# Patient Record
Sex: Female | Born: 1985 | Race: White | Hispanic: No | Marital: Married | Smoking: Never smoker
Health system: Southern US, Community
[De-identification: ages and names within clinical notes are randomized; demographics above are authoritative.]

## PROBLEM LIST (undated history)

## (undated) DIAGNOSIS — E669 Obesity, unspecified: Secondary | ICD-10-CM

## (undated) HISTORY — PX: OTHER SURGICAL HISTORY: SHX169

## (undated) HISTORY — DX: Obesity, unspecified: E66.9

---
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
## 2007-01-16 ENCOUNTER — Ambulatory Visit: Payer: Self-pay | Admitting: Family Medicine

## 2007-02-10 ENCOUNTER — Ambulatory Visit: Payer: Self-pay | Admitting: Family Medicine

## 2007-02-13 ENCOUNTER — Ambulatory Visit (HOSPITAL_COMMUNITY): Admission: RE | Admit: 2007-02-13 | Discharge: 2007-02-13 | Payer: Self-pay | Admitting: Family Medicine

## 2007-04-13 ENCOUNTER — Ambulatory Visit: Payer: Self-pay | Admitting: Family Medicine

## 2007-04-16 ENCOUNTER — Ambulatory Visit: Payer: Self-pay | Admitting: Family Medicine

## 2007-04-16 ENCOUNTER — Ambulatory Visit (HOSPITAL_COMMUNITY): Admission: RE | Admit: 2007-04-16 | Discharge: 2007-04-16 | Payer: Self-pay | Admitting: Family Medicine

## 2007-04-16 LAB — CONVERTED CEMR LAB
BUN: 5 mg/dL — ABNORMAL LOW (ref 6–23)
Basophils Absolute: 0 10*3/uL (ref 0.0–0.1)
CO2: 26 meq/L (ref 19–32)
Calcium: 9.4 mg/dL (ref 8.4–10.5)
Creatinine, Ser: 0.83 mg/dL (ref 0.40–1.20)
Eosinophils Absolute: 0.4 10*3/uL (ref 0.0–0.7)
Glucose, Bld: 94 mg/dL (ref 70–99)
Hemoglobin: 12.8 g/dL (ref 12.0–15.0)
Lymphocytes Relative: 16 % (ref 12–46)
Lymphs Abs: 0.7 10*3/uL (ref 0.7–3.3)
Monocytes Absolute: 0.2 10*3/uL (ref 0.2–0.7)
Neutrophils Relative %: 70 % (ref 43–77)
Potassium: 4.8 meq/L (ref 3.5–5.3)
RBC: 4.69 M/uL (ref 3.87–5.11)
RDW: 16.1 % — ABNORMAL HIGH (ref 11.5–14.0)
Sodium: 136 meq/L (ref 135–145)
WBC: 4.4 10*3/uL (ref 4.0–10.5)

## 2007-05-21 ENCOUNTER — Ambulatory Visit: Payer: Self-pay | Admitting: Family Medicine

## 2007-07-28 ENCOUNTER — Encounter: Payer: Self-pay | Admitting: Family Medicine

## 2007-07-28 ENCOUNTER — Other Ambulatory Visit: Admission: RE | Admit: 2007-07-28 | Discharge: 2007-07-28 | Payer: Self-pay | Admitting: Family Medicine

## 2007-07-28 ENCOUNTER — Ambulatory Visit: Payer: Self-pay | Admitting: Family Medicine

## 2007-08-20 ENCOUNTER — Encounter: Payer: Self-pay | Admitting: Family Medicine

## 2007-08-24 IMAGING — CR DG CHEST 2V
2 series · 2 of 2 positions shown · non-contrast
Comparison: 06/12/06.

CLINICAL DATA: Bronchitis, sinusitis.  
 CHEST - 2 VIEW:

[view not recorded (1 of 2)]
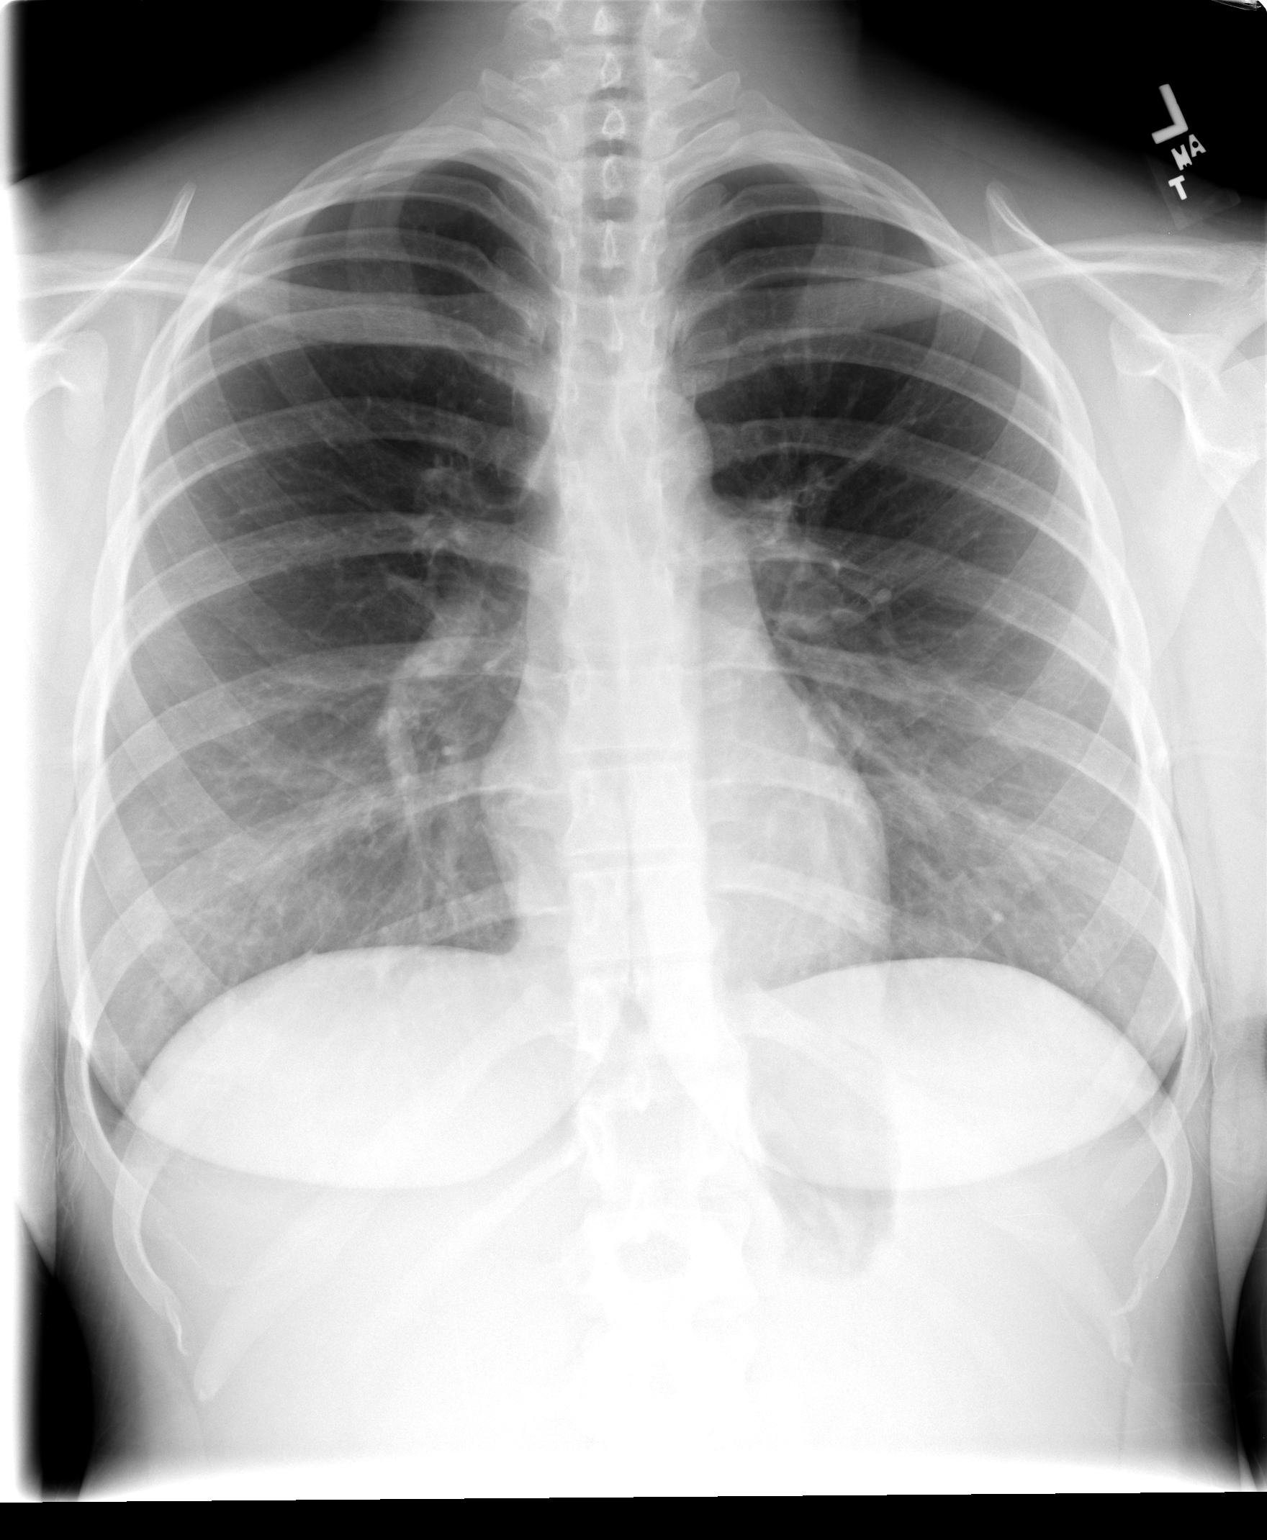

[view not recorded (2 of 2)]
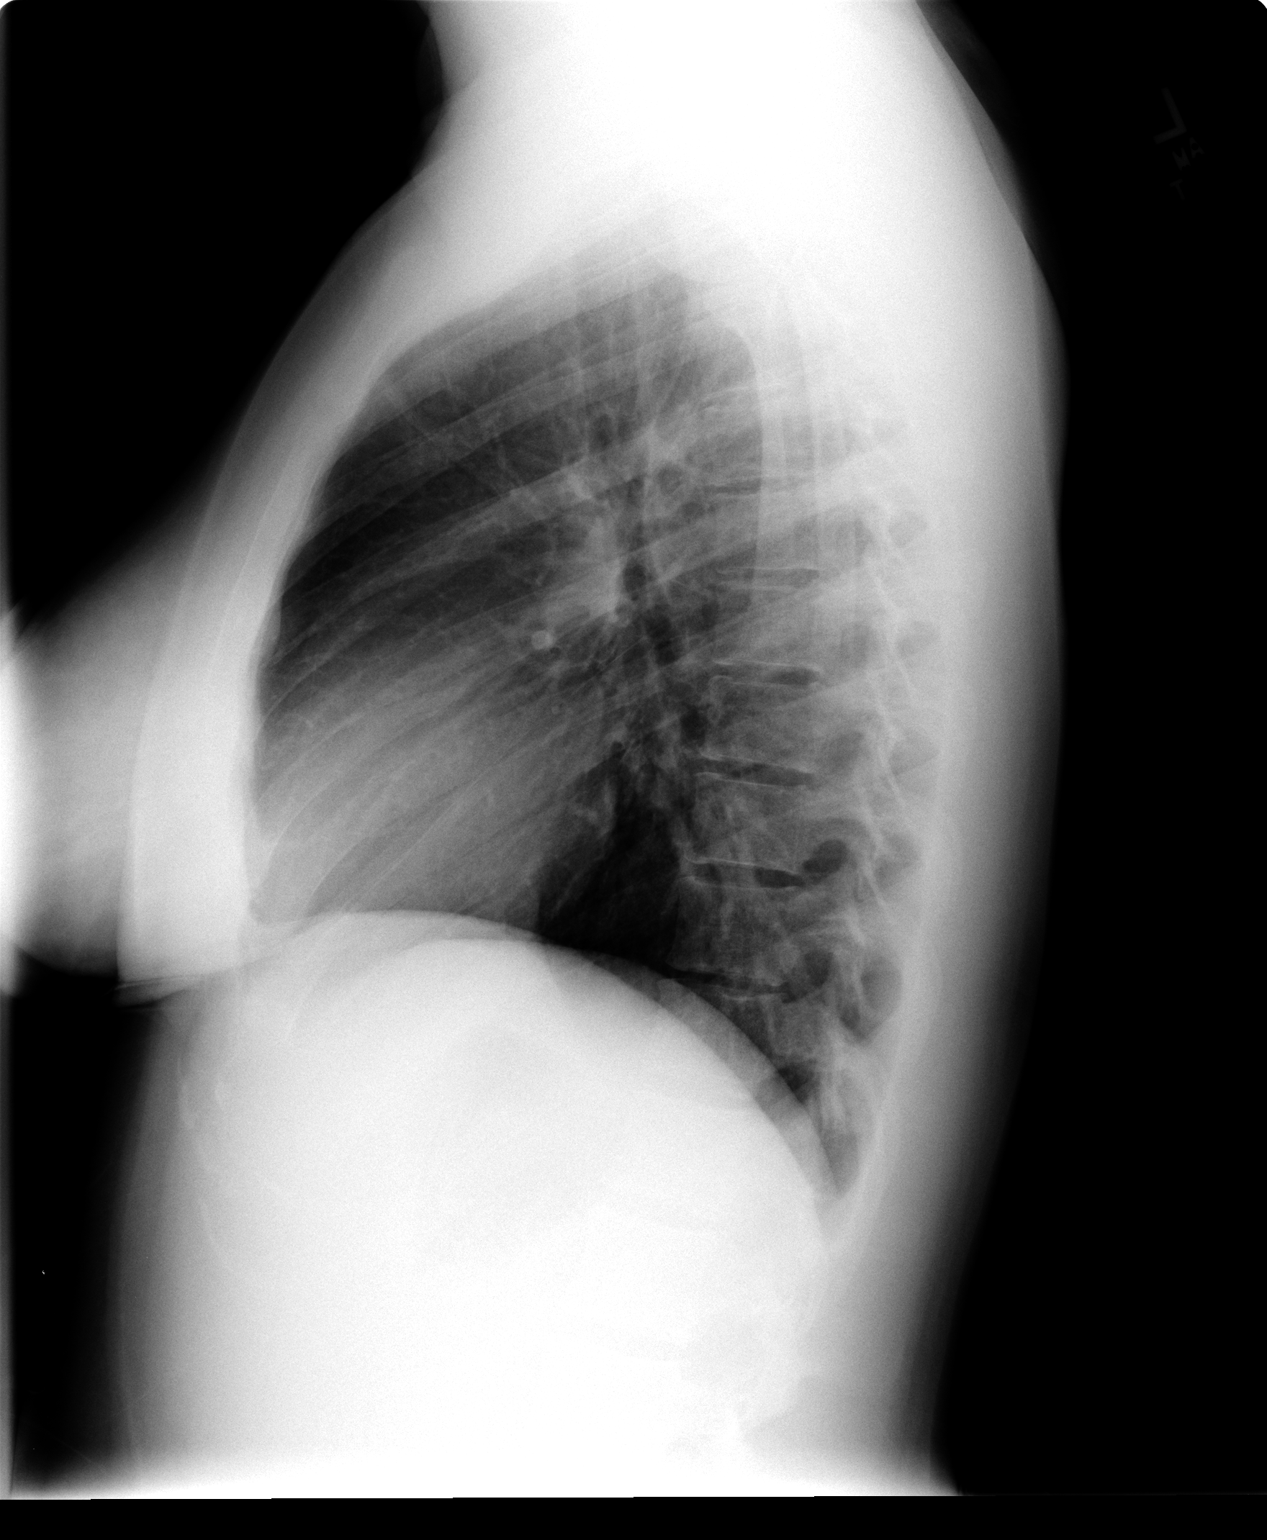

[2 of 2 positions shown; findings below may reference images not displayed]

FINDINGS: Lungs moderately hyperaerated but clear of an active process.  Symmetric hila.  Cardiomediastinal silhouette size and contours are normal.
IMPRESSION: Pulmonary hyperaeration.  No infiltrate/atelectasis.

## 2007-11-13 ENCOUNTER — Ambulatory Visit: Payer: Self-pay | Admitting: Family Medicine

## 2007-12-24 ENCOUNTER — Encounter: Payer: Self-pay | Admitting: Family Medicine

## 2007-12-24 LAB — CONVERTED CEMR LAB
Rubella: 50.9 intl units/mL — ABNORMAL HIGH
Varicella-Zoster Ab, IgM: 1.85 — ABNORMAL HIGH (ref <0.91)

## 2008-01-14 IMAGING — US US CAROTID DUPLEX BILAT
1 series · 14 of 24 positions shown · non-contrast
Comparison: none

HISTORY: Carotid bruit, lightheadedness

[Series 1: carotid · 0.06mm/px · 14 of 73 slices shown]
[im 1/73]
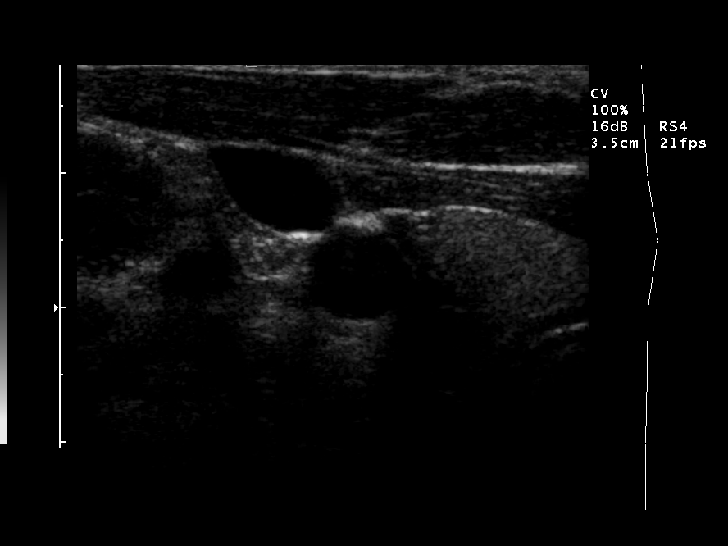
[im 7/73]
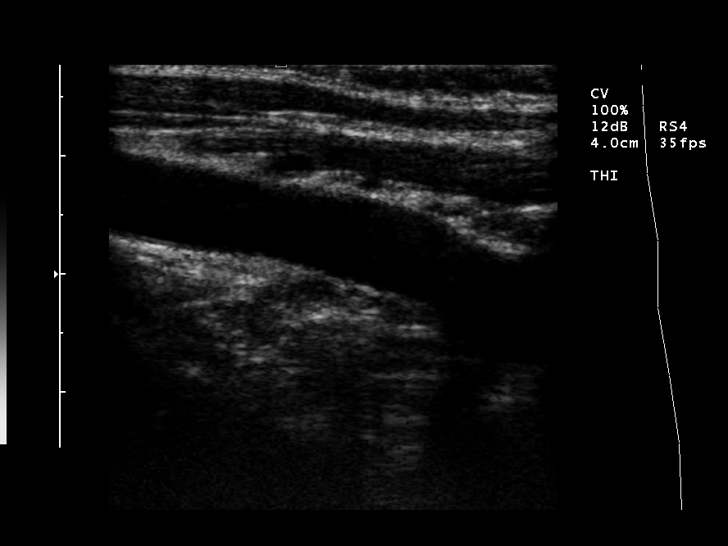
[im 13/73]
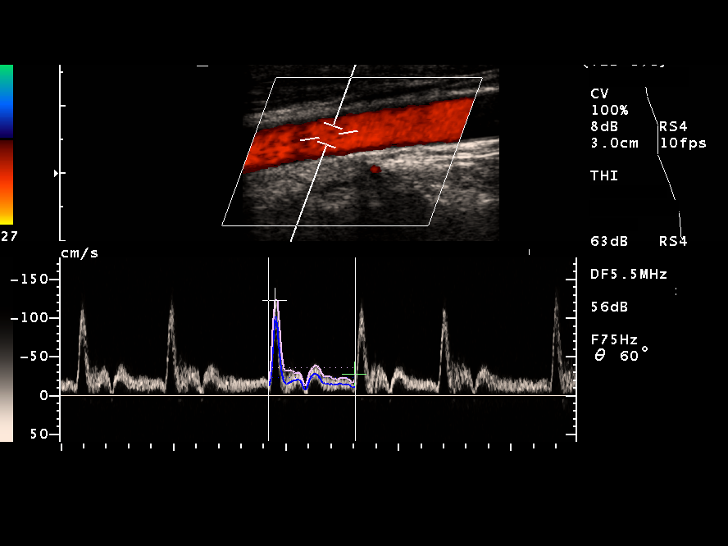
[im 19/73]
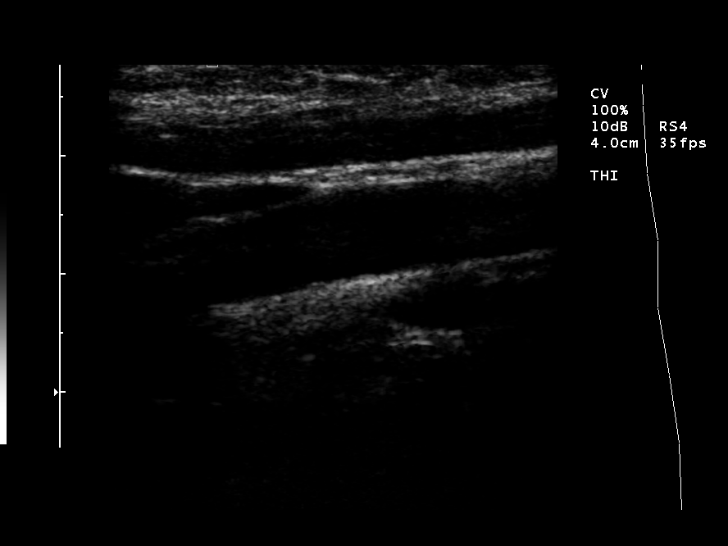
[im 22/73]
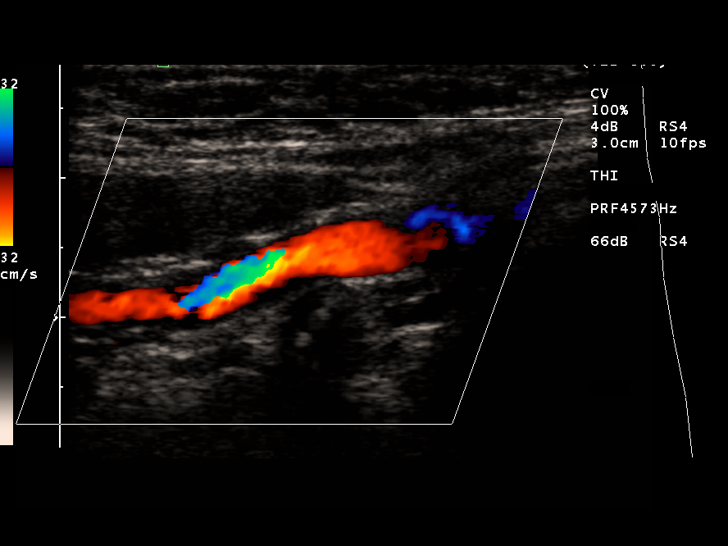
[im 29/73]
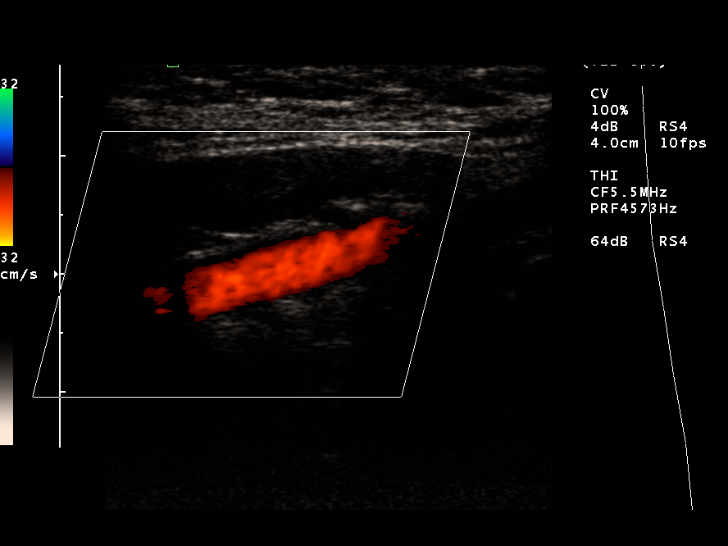
[im 35/73]
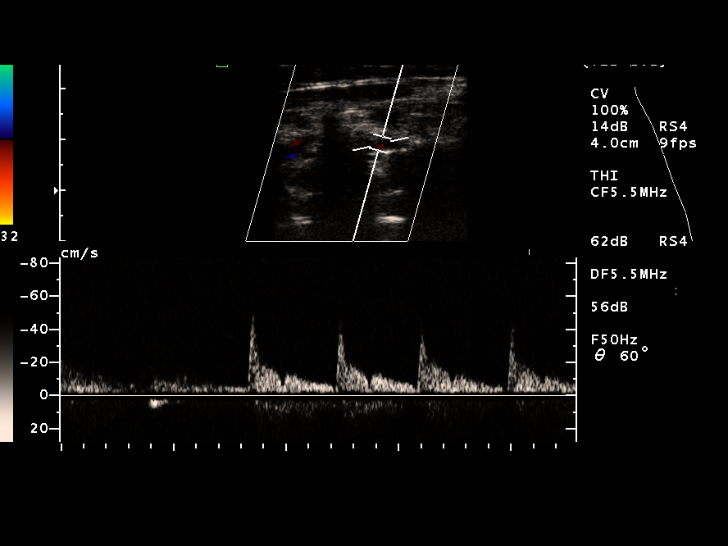
[im 38/73]
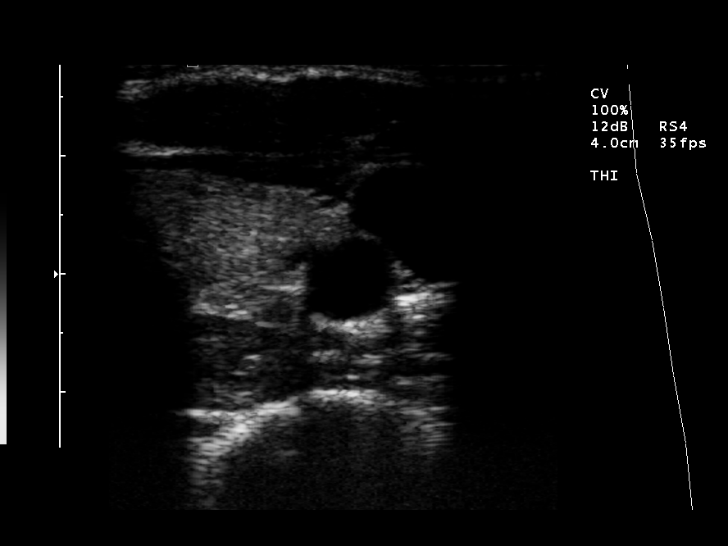
[im 44/73]
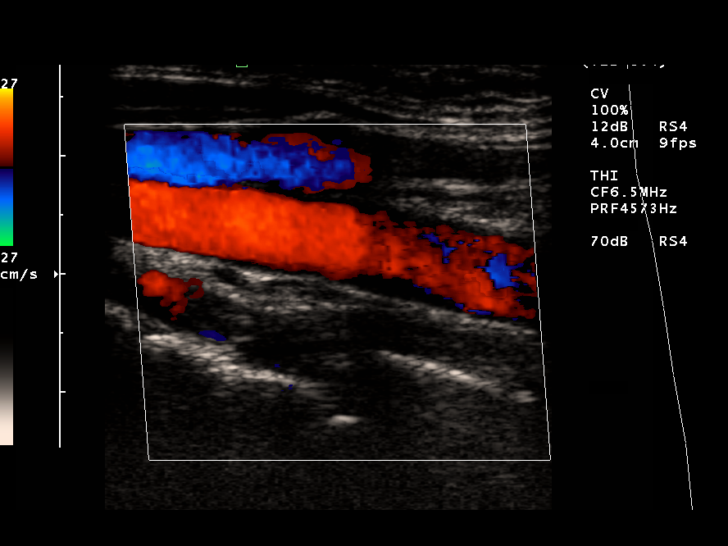
[im 51/73]
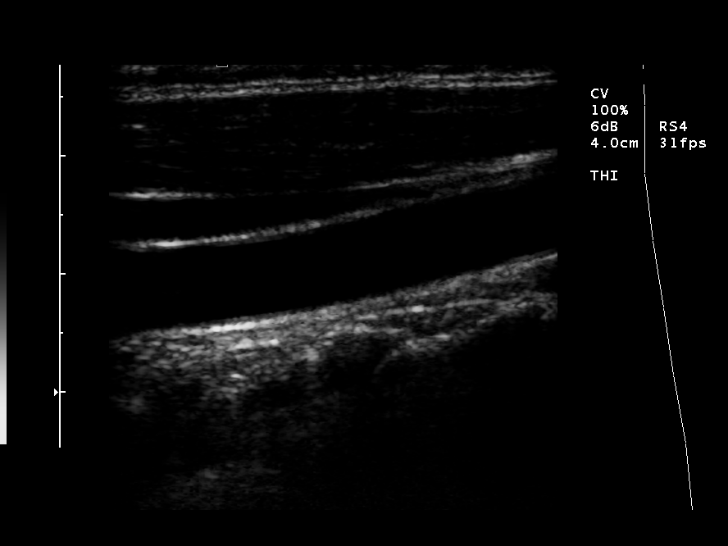
[im 57/73]
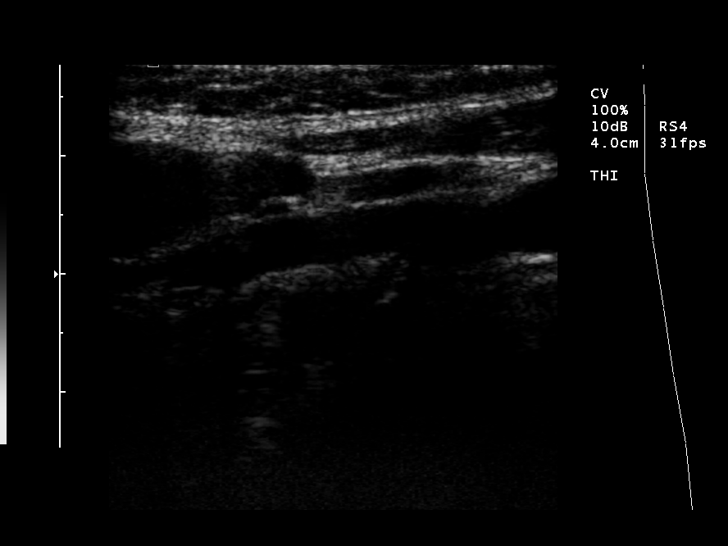
[im 60/73]
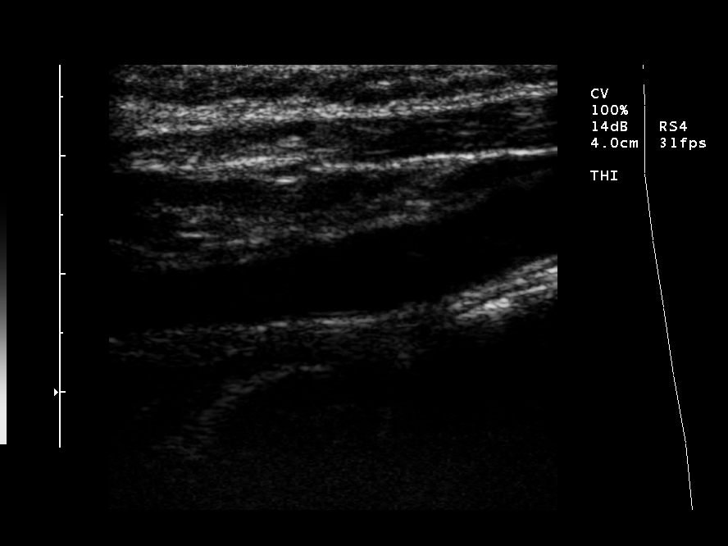
[im 66/73]
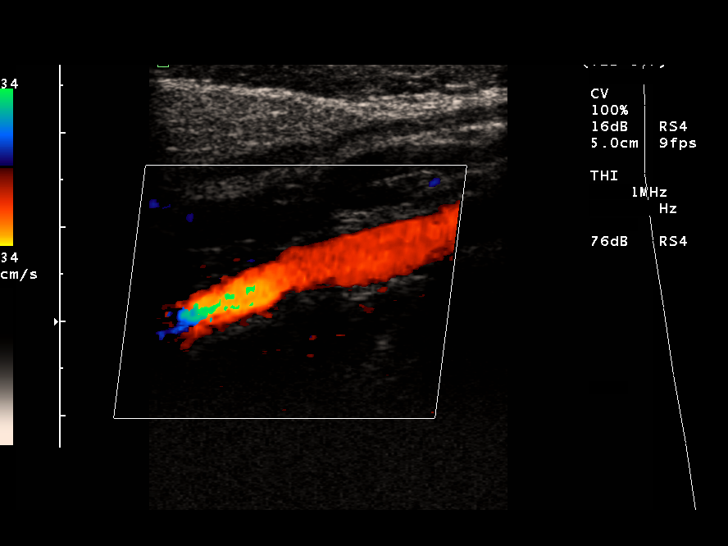
[im 73/73]
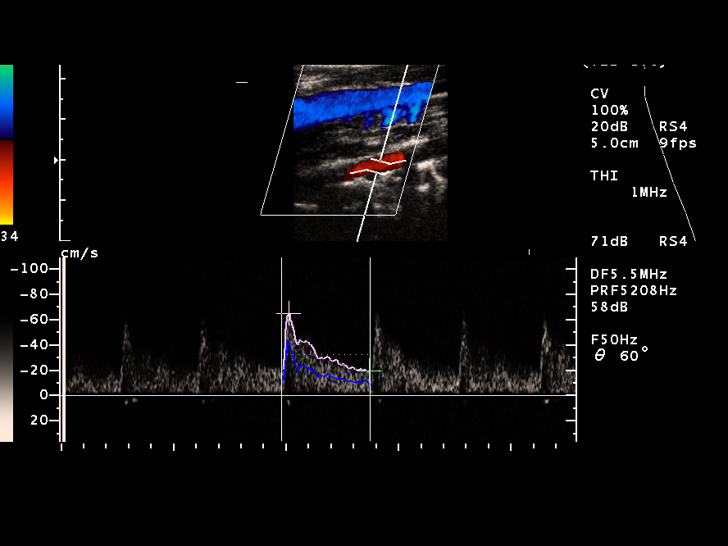

[14 of 24 positions shown; findings below may reference images not displayed]

ULTRASOUND CAROTID DUPLEX DOPPLER BILATERAL:

Gray scale, color Doppler, and pulse wave imaging of carotid systems performed
bilaterally. 

No plaque formation identified on gray scale imaging.
Laminar flow on color Doppler imaging without turbulence or high velocity.
Waveforms unremarkable.
Following peak systolic velocities obtained, in cm per second:

Right CCA  122
Right ICA  83
Right ECA  93
Right ICA/CCA ratio
Right ICA EDV  41
Right CCA EDV  27
Right ICA/CCA EDV ratio

Left  CCA  121
Left  ICA  93
Left  ECA  81
Left  ICA/CCA ratio
Left  ICA EDV  44
Left  CCA EDV  29
Left  ICA/CCA EDV ratio

Antegrade flow present bilateral vertebral region.
IMPRESSION: Normal exam.

## 2008-03-24 ENCOUNTER — Ambulatory Visit: Payer: Self-pay | Admitting: Family Medicine

## 2008-03-24 ENCOUNTER — Encounter: Payer: Self-pay | Admitting: Family Medicine

## 2008-03-25 DIAGNOSIS — J029 Acute pharyngitis, unspecified: Secondary | ICD-10-CM

## 2008-04-29 ENCOUNTER — Telehealth: Payer: Self-pay | Admitting: Family Medicine

## 2008-06-27 ENCOUNTER — Ambulatory Visit: Payer: Self-pay | Admitting: Family Medicine

## 2008-06-27 DIAGNOSIS — M79609 Pain in unspecified limb: Secondary | ICD-10-CM

## 2008-07-07 ENCOUNTER — Telehealth: Payer: Self-pay | Admitting: Family Medicine

## 2008-07-11 ENCOUNTER — Encounter: Payer: Self-pay | Admitting: Family Medicine

## 2008-08-15 ENCOUNTER — Encounter: Payer: Self-pay | Admitting: Family Medicine

## 2008-08-15 ENCOUNTER — Other Ambulatory Visit: Admission: RE | Admit: 2008-08-15 | Discharge: 2008-08-15 | Payer: Self-pay | Admitting: Family Medicine

## 2008-08-15 ENCOUNTER — Ambulatory Visit: Payer: Self-pay | Admitting: Family Medicine

## 2008-08-15 DIAGNOSIS — N760 Acute vaginitis: Secondary | ICD-10-CM | POA: Insufficient documentation

## 2008-08-15 DIAGNOSIS — R5383 Other fatigue: Secondary | ICD-10-CM

## 2008-08-15 DIAGNOSIS — R5381 Other malaise: Secondary | ICD-10-CM

## 2008-08-16 ENCOUNTER — Encounter: Payer: Self-pay | Admitting: Family Medicine

## 2008-08-16 LAB — CONVERTED CEMR LAB
Candida species: NEGATIVE
GC Probe Amp, Genital: NEGATIVE

## 2008-08-17 LAB — CONVERTED CEMR LAB
BUN: 8 mg/dL (ref 6–23)
Basophils Relative: 0 % (ref 0–1)
CO2: 21 meq/L (ref 19–32)
Chloride: 107 meq/L (ref 96–112)
Eosinophils Absolute: 0.4 10*3/uL (ref 0.0–0.7)
Hemoglobin: 13.2 g/dL (ref 12.0–15.0)
LDL Cholesterol: 113 mg/dL — ABNORMAL HIGH (ref 0–99)
Lymphocytes Relative: 45 % (ref 12–46)
Lymphs Abs: 2.8 10*3/uL (ref 0.7–4.0)
Neutro Abs: 2.7 10*3/uL (ref 1.7–7.7)
Platelets: 265 10*3/uL (ref 150–400)
Potassium: 4.2 meq/L (ref 3.5–5.3)
RDW: 13.8 % (ref 11.5–15.5)
TSH: 1.247 microintl units/mL (ref 0.350–4.50)
Total CHOL/HDL Ratio: 4.4
Triglycerides: 186 mg/dL — ABNORMAL HIGH (ref <150)

## 2009-01-02 ENCOUNTER — Telehealth: Payer: Self-pay | Admitting: Family Medicine

## 2009-01-03 ENCOUNTER — Encounter: Payer: Self-pay | Admitting: Family Medicine

## 2009-02-07 ENCOUNTER — Ambulatory Visit: Payer: Self-pay | Admitting: Family Medicine

## 2009-04-28 ENCOUNTER — Ambulatory Visit: Payer: Self-pay | Admitting: Family Medicine

## 2009-05-05 ENCOUNTER — Telehealth: Payer: Self-pay | Admitting: Family Medicine

## 2009-05-18 ENCOUNTER — Telehealth: Payer: Self-pay | Admitting: Family Medicine

## 2009-06-09 ENCOUNTER — Encounter: Payer: Self-pay | Admitting: Family Medicine

## 2009-07-04 ENCOUNTER — Ambulatory Visit: Payer: Self-pay | Admitting: Family Medicine

## 2009-07-04 DIAGNOSIS — J309 Allergic rhinitis, unspecified: Secondary | ICD-10-CM

## 2009-07-05 ENCOUNTER — Telehealth: Payer: Self-pay | Admitting: Family Medicine

## 2009-07-07 ENCOUNTER — Encounter: Payer: Self-pay | Admitting: Family Medicine

## 2009-07-07 LAB — CONVERTED CEMR LAB
Candida species: NEGATIVE
Gardnerella vaginalis: NEGATIVE

## 2009-08-07 ENCOUNTER — Telehealth: Payer: Self-pay | Admitting: Family Medicine

## 2009-08-21 ENCOUNTER — Other Ambulatory Visit: Admission: RE | Admit: 2009-08-21 | Discharge: 2009-08-21 | Payer: Self-pay | Admitting: Family Medicine

## 2009-08-21 ENCOUNTER — Ambulatory Visit: Payer: Self-pay | Admitting: Family Medicine

## 2009-08-22 LAB — CONVERTED CEMR LAB
BUN: 10 mg/dL (ref 6–23)
Creatinine, Ser: 0.7 mg/dL (ref 0.40–1.20)
Eosinophils Absolute: 0.3 10*3/uL (ref 0.0–0.7)
Eosinophils Relative: 4 % (ref 0–5)
Glucose, Bld: 82 mg/dL (ref 70–99)
HCT: 37.8 % (ref 36.0–46.0)
HDL: 44 mg/dL (ref >39)
Lymphs Abs: 2.1 10*3/uL (ref 0.7–4.0)
MCHC: 31.7 g/dL (ref 30.0–36.0)
Monocytes Absolute: 0.4 10*3/uL (ref 0.1–1.0)
Monocytes Relative: 7 % (ref 3–12)
Neutro Abs: 3.4 10*3/uL (ref 1.7–7.7)
Neutrophils Relative %: 55 % (ref 43–77)
Platelets: 265 10*3/uL (ref 150–400)
Potassium: 4.4 meq/L (ref 3.5–5.3)
RDW: 14.3 % (ref 11.5–15.5)
Total CHOL/HDL Ratio: 4.7
Triglycerides: 218 mg/dL — ABNORMAL HIGH (ref <150)

## 2010-01-21 ENCOUNTER — Emergency Department (HOSPITAL_COMMUNITY)
Admission: EM | Admit: 2010-01-21 | Discharge: 2010-01-21 | Payer: Self-pay | Source: Home / Self Care | Admitting: Emergency Medicine

## 2010-01-22 ENCOUNTER — Telehealth: Payer: Self-pay | Admitting: Family Medicine

## 2010-02-01 ENCOUNTER — Ambulatory Visit: Payer: Self-pay | Admitting: Family Medicine

## 2010-02-01 DIAGNOSIS — F329 Major depressive disorder, single episode, unspecified: Secondary | ICD-10-CM

## 2010-02-13 ENCOUNTER — Ambulatory Visit: Payer: Self-pay | Admitting: Family Medicine

## 2010-03-15 ENCOUNTER — Ambulatory Visit: Payer: Self-pay | Admitting: Family Medicine

## 2010-05-22 ENCOUNTER — Ambulatory Visit: Payer: Self-pay | Admitting: Family Medicine

## 2010-05-30 ENCOUNTER — Encounter: Payer: Self-pay | Admitting: Family Medicine

## 2010-06-20 ENCOUNTER — Encounter: Payer: Self-pay | Admitting: Family Medicine

## 2010-07-17 ENCOUNTER — Ambulatory Visit: Payer: Self-pay | Admitting: Family Medicine

## 2010-07-17 DIAGNOSIS — J018 Other acute sinusitis: Secondary | ICD-10-CM

## 2010-07-17 DIAGNOSIS — J209 Acute bronchitis, unspecified: Secondary | ICD-10-CM

## 2010-07-20 ENCOUNTER — Telehealth: Payer: Self-pay | Admitting: Family Medicine

## 2010-08-21 LAB — HM PAP SMEAR

## 2010-09-10 ENCOUNTER — Encounter: Payer: Self-pay | Admitting: Family Medicine

## 2010-09-11 LAB — CONVERTED CEMR LAB
Basophils Absolute: 0 10*3/uL (ref 0.0–0.1)
Creatinine, Ser: 0.72 mg/dL (ref 0.40–1.20)
Eosinophils Absolute: 0.2 10*3/uL (ref 0.0–0.7)
HDL: 42 mg/dL (ref >39)
LDL Cholesterol: 110 mg/dL — ABNORMAL HIGH (ref 0–99)
Lymphocytes Relative: 35 % (ref 12–46)
Lymphs Abs: 2.5 10*3/uL (ref 0.7–4.0)
MCV: 80.8 fL (ref 78.0–100.0)
Monocytes Absolute: 0.5 10*3/uL (ref 0.1–1.0)
Monocytes Relative: 7 % (ref 3–12)
Neutro Abs: 3.8 10*3/uL (ref 1.7–7.7)
Neutrophils Relative %: 54 % (ref 43–77)
Platelets: 293 10*3/uL (ref 150–400)
Potassium: 4.7 meq/L (ref 3.5–5.3)
Sodium: 139 meq/L (ref 135–145)
TSH: 0.72 microintl units/mL (ref 0.350–4.500)
Triglycerides: 177 mg/dL — ABNORMAL HIGH (ref <150)
VLDL: 35 mg/dL (ref 0–40)
WBC: 7.1 10*3/uL (ref 4.0–10.5)

## 2010-09-13 ENCOUNTER — Other Ambulatory Visit: Payer: Self-pay | Admitting: Family Medicine

## 2010-09-13 ENCOUNTER — Other Ambulatory Visit
Admission: RE | Admit: 2010-09-13 | Discharge: 2010-09-13 | Payer: Self-pay | Source: Home / Self Care | Admitting: Family Medicine

## 2010-09-13 ENCOUNTER — Ambulatory Visit
Admission: RE | Admit: 2010-09-13 | Discharge: 2010-09-13 | Payer: Self-pay | Source: Home / Self Care | Attending: Family Medicine | Admitting: Family Medicine

## 2010-09-13 DIAGNOSIS — M25569 Pain in unspecified knee: Secondary | ICD-10-CM | POA: Insufficient documentation

## 2010-09-16 DIAGNOSIS — L259 Unspecified contact dermatitis, unspecified cause: Secondary | ICD-10-CM | POA: Insufficient documentation

## 2010-09-17 ENCOUNTER — Encounter: Payer: Self-pay | Admitting: Family Medicine

## 2010-09-18 NOTE — Progress Notes (Signed)
Summary: NECK HURTS  Phone Note Call from Patient   Summary of Call: NECK HAS BEEN HURTING FOR  THE PAST 2 DAYS AND CAN NOT SLEEP KEEPS WAKING HER UP SHE IS TAKING TYLENOL BUT IT IS DOING NO GGOD CALL BACK AT [phone] Initial call taken by: Lind Guest,  July 20, 2010 9:37 AM  Follow-up for Phone Call        i advise ibuprofen 800mg  Take 1 tablet by mouth three times a day #21 for pain, and cyclobenzaprine 10mg  .1 at bedtime , as neededsend in after spking with her pls 10 only,  Follow-up by: Syliva Overman MD,  July 20, 2010 12:33 PM  Additional Follow-up for Phone Call Additional follow up Details #1::        meds sent, patient aware Additional Follow-up by: Adella Hare LPN,  July 20, 2010 2:22 PM    New/Updated Medications: IBUPROFEN 800 MG TABS (IBUPROFEN) one tab by mouth three times a day as needed CYCLOBENZAPRINE HCL 10 MG TABS (CYCLOBENZAPRINE HCL) one tab by mouth at bedtime as needed Prescriptions: CYCLOBENZAPRINE HCL 10 MG TABS (CYCLOBENZAPRINE HCL) one tab by mouth at bedtime as needed  #10 x 0   Entered by:   Adella Hare LPN   Authorized by:   Syliva Overman MD   Signed by:   Adella Hare LPN on 52/84/1324   Method used:   Electronically to        [facility]  [address] (retail)       [address]       [place], [place]  [address]       Ph: [phone]       Fax: [phone]   RxID:   [id number] IBUPROFEN 800 MG TABS (IBUPROFEN) one tab by mouth three times a day as needed  #21 x 0   Entered by:   Adella Hare LPN   Authorized by:   Syliva Overman MD   Signed by:   Adella Hare LPN on 02/16/1600   Method used:   Electronically to        [facility]  [address] (retail)       [address]       [place], [place]  [address]       Ph: [phone]       Fax: [phone]   RxID:   [id number]

## 2010-09-18 NOTE — Letter (Signed)
Summary: labs  labs   Imported By: Curtis Sites 02/07/2010 12:21:02  _____________________________________________________________________  External Attachment:    Type:   Image     Comment:   External Document

## 2010-09-18 NOTE — Letter (Signed)
Summary: [facility]M  [facility] MEDICAL FORM   Imported By: Lind Guest 06/20/2010 17:07:33  _____________________________________________________________________  External Attachment:    Type:   Image     Comment:   External Document

## 2010-09-18 NOTE — Assessment & Plan Note (Signed)
Summary: F UP   Vital Signs:  Patient profile:   25 year old female Menstrual status:  regular Height:      65.5 inches Weight:      222.50 pounds BMI:     36.59 O2 Sat:      98 % Pulse rate:   86 / minute Pulse rhythm:   regular Resp:     16 per minute BP sitting:   110 / 80 Cuff size:   large  Vitals Entered By: Everitt Amber LPN (February 01, 2010 4:33 PM)  Nutrition Counseling: Patient's BMI is greater than 25 and therefore counseled on weight management options. CC: Follow up chronic problems   Primary Care Provider:  Syliva Overman MD  CC:  Follow up chronic problems.  History of Present Illness: Reports  thatshe has not been doing well, in the past 1 month, since she failed and ewxam. swhe is gaining weight as she overcompensates with excessive eating, she wants help. Denies recent fever or chills. Denies sinus pressure, nasal congestion , ear pain or sore throat. Denies chest congestion, or cough productive of sputum. Denies chest pain, palpitations, PND, orthopnea or leg swelling. Denies abdominal pain, nausea, vomitting, diarrhea or constipation. Denies change in bowel movements or bloody stool. Denies dysuria , frequency, incontinence or hesitancy. Denies  joint pain, swelling, or reduced mobility. Denies headaches, vertigo, seizures.  Denies  rash, lesions, or itch.     Current Medications (verified): 1)  Ortho-Novum 7/7/7 (28) 0.5/0.75/1-35 Mg-Mcg  Tabs (Norethin-Eth Estrad Triphasic) .... Take 1 Tablet By Mouth Once A Day  Allergies (verified): No Known Drug Allergies  Review of Systems      See HPI General:  Complains of loss of appetite, malaise, and weakness. Eyes:  Denies vision loss-1 eye. Psych:  Complains of anxiety, depression, easily tearful, irritability, and mental problems; denies suicidal thoughts/plans, thoughts of violence, and unusual visions or sounds; triggered by recent acqademic failure. Endo:  Denies cold intolerance, excessive  hunger, excessive thirst, excessive urination, heat intolerance, polyuria, and weight change. Heme:  Denies abnormal bruising and bleeding. Allergy:  Complains of seasonal allergies; mild symptoms currently.  Physical Exam  General:  Well-developed,obese,in no acute distress; alert,appropriate and cooperative throughout examination HEENT: No facial asymmetry,  EOMI, No sinus tenderness, TM's Clear, oropharynx  pink and moist.   Chest: Clear to auscultation bilaterally.  CVS: S1, S2, No murmurs, No S3.   Abd: Soft, Nontender.  MS: Adequate ROM spine, hips, shoulders and knees.  Ext: No edema.   CNS: CN 2-12 intact, power tone and sensation normal throughout.   Skin: Intact, no visible lesions or rashes.  Psych: Good eye contact,at times there was avoidance of eye contact,flat affect.  Memory intact,  depressed appearing and tearful..    Impression & Recommendations:  Problem # 1:  DEPRESSION, MILD (ICD-311) Assessment Deteriorated  Her updated medication list for this problem includes:    Fluoxetine Hcl 10 Mg Caps (Fluoxetine hcl) .Marland Kitchen... Take 1 tablet by mouth once a day, this is essentilly and the paT HAS EXCCLENT SUPPORT FROM her family, she should do well  Discussed treatment options, including trial of antidpressant medication. . Follow-up call in in 24-48 hours and recheck in 2 weeks, sooner as needed. Patient agrees to call if any worsening of symptoms or thoughts of doing harm arise. Verified that the patient has no suicidal ideation at this time.   Problem # 2:  FATIGUE (ICD-780.79) Assessment: Deteriorated  Problem # 3:  OBESITY, MILD (  ICD-278.02) Assessment: Deteriorated  Ht: 65.5 (02/01/2010)   Wt: 222.50 (02/01/2010)   BMI: 36.59 (02/01/2010)  Complete Medication List: 1)  Ortho-novum 7/7/7 (28) 0.5/0.75/1-35 Mg-mcg Tabs (Norethin-eth estrad triphasic) .... Take 1 tablet by mouth once a day 2)  Fluoxetine Hcl 10 Mg Caps (Fluoxetine hcl) .... Take 1 tablet by mouth  once a day  Patient Instructions: 1)  F/U in 6 weeks. 2)  Call if any adverse reaction to your new med and stop please. 3)    Prescriptions: FLUOXETINE HCL 10 MG CAPS (FLUOXETINE HCL) Take 1 tablet by mouth once a day  #30 x 2   Entered and Authorized by:   Syliva Overman MD   Signed by:   Syliva Overman MD on 02/01/2010   Method used:   Electronically to        [facility]  [address] (retail)       [address]       [place], [place]  [address]       Ph: [phone]       Fax: [phone]   RxID:   [id number]

## 2010-09-18 NOTE — Letter (Signed)
Summary: TRANSFERRED RECORDS  TRANSFERRED RECORDS   Imported By: Lind Guest 05/17/2010 13:38:16  _____________________________________________________________________  External Attachment:    Type:   Image     Comment:   External Document

## 2010-09-18 NOTE — Assessment & Plan Note (Signed)
Summary: THROAT   Vital Signs:  Patient profile:   25 year old female Menstrual status:  regular Height:      65.5 inches Weight:      223.75 pounds BMI:     36.80 O2 Sat:      94 % Pulse rate:   87 / minute Pulse rhythm:   regular Resp:     16 per minute BP sitting:   110 / 80  (left arm) Cuff size:   large  Vitals Entered By: Everitt Amber LPN (February 13, 2010 9:15 AM)  Nutrition Counseling: Patient's BMI is greater than 25 and therefore counseled on weight management options. CC: throat hurting for the past 2 days, drainage in her ears down her throat   Primary Care Provider:  Syliva Overman MD  CC:  throat hurting for the past 2 days and drainage in her ears down her throat.  History of Present Illness: sore throat, post nasal drainage  x 2 days. no fever or chills  or cough, she has positive exposore to infected sibling and is concerned. she is trying  to incorporate regular physical activity, and to modify her diet to facilitate weight loss.   Current Medications (verified): 1)  Ortho-Novum 7/7/7 (28) 0.5/0.75/1-35 Mg-Mcg  Tabs (Norethin-Eth Estrad Triphasic) .... Take 1 Tablet By Mouth Once A Day 2)  Fluoxetine Hcl 10 Mg Caps (Fluoxetine Hcl) .... Take 1 Tablet By Mouth Once A Day  Allergies (verified): No Known Drug Allergies  Review of Systems      See HPI General:  Complains of chills, loss of appetite, malaise, sweats, and weakness. Eyes:  Denies blurring and discharge. CV:  Denies chest pain or discomfort, palpitations, and swelling of feet. GI:  Denies abdominal pain, constipation, diarrhea, nausea, and vomiting. GU:  Denies dysuria and urinary frequency.  Physical Exam  General:  Well-developed,obese,in no acute distress; alert,appropriate and cooperative throughout examinationmildly ill appeaing. HEENT: No facial asymmetry,  EOMI, No sinus tenderness, TM's Clear, oropharynx  erythematous and moist, no exudate, tender ant cervical adenopathy,  bilaterally.   Chest: Clear to auscultation bilaterally.  CVS: S1, S2, No murmurs, No S3.   Abd: Soft, Nontender.  MS: Adequate ROM spine, hips, shoulders and knees.  Ext: No edema.   CNS: CN 2-12 intact, power tone and sensation normal throughout.   Skin: Intact, no visible lesions or rashes.  Psych: Good eye contact,at times there was avoidance of eye contact,flat affect.  Memory intact,  depressed appearing and tearful..    Impression & Recommendations:  Problem # 1:  DEPRESSION, MILD (ICD-311) Assessment Improved  Her updated medication list for this problem includes:    Fluoxetine Hcl 10 Mg Caps (Fluoxetine hcl) .Marland Kitchen... Take 1 tablet by mouth once a day  Problem # 2:  ACUTE PHARYNGITIS (ICD-462) Assessment: Comment Only  Her updated medication list for this problem includes:    Zithromax Z-pak 250 Mg Tabs (Azithromycin) ..... Use as directed rapid strep test is negative  Problem # 3:  OBESITY (ICD-278.00) Assessment: Unchanged  Ht: 65.5 (02/13/2010)   Wt: 223.75 (02/13/2010)   BMI: 36.80 (02/13/2010)  Complete Medication List: 1)  Ortho-novum 7/7/7 (28) 0.5/0.75/1-35 Mg-mcg Tabs (Norethin-eth estrad triphasic) .... Take 1 tablet by mouth once a day 2)  Fluoxetine Hcl 10 Mg Caps (Fluoxetine hcl) .... Take 1 tablet by mouth once a day 3)  Zithromax Z-pak 250 Mg Tabs (Azithromycin) .... Use as directed  Patient Instructions: 1)  F/u as before. 2)  You are  being treated for acute pharyngits. 3)  pls remeber to use condoms for "back up ' protection since you will be taking antibiotics 4)  It is important that you exercise regularly at least 20 minutes 5 times a week. If you develop chest pain, have severe difficulty breathing, or feel very tired , stop exercising immediately and seek medical attention. 5)  You need to lose weight. Consider a lower calorie diet and regular exercise.  6)  I am happy that you are doing alittle better overall Prescriptions: ZITHROMAX Z-PAK 250  MG TABS (AZITHROMYCIN) Use as directed  #1 x 0   Entered and Authorized by:   Syliva Overman MD   Signed by:   Syliva Overman MD on 02/13/2010   Method used:   Electronically to        [facility]  [address] (retail)       [address]       [place], [place]  [address]       Ph: [phone]       Fax: [phone]   RxID:   [id number]

## 2010-09-18 NOTE — Letter (Signed)
Summary: new rx  new rx   Imported By: Lind Guest 05/22/2010 15:27:11  _____________________________________________________________________  External Attachment:    Type:   Image     Comment:   External Document

## 2010-09-18 NOTE — Progress Notes (Signed)
Summary: MESSAGE  Phone Note Call from Patient   Summary of Call: WANTED YOU TO KNOW THAT SHE HAD BEEN  BITTEN BY A DOG AND JUST WANTED YOU TO KNOW WILL SEE YOU NEXT WEEK Initial call taken by: Lind Guest,  January 22, 2010 2:03 PM  Follow-up for Phone Call        advise if any sign of infection fill the script for augmentin I am sending in, also she needs to be sure the dog has rabies vaccines up to date, if unsure she needs to go to the health dept Follow-up by: Syliva Overman MD,  January 22, 2010 4:57 PM  Additional Follow-up for Phone Call Additional follow up Details #1::        called patient, left message Additional Follow-up by: Adella Hare LPN,  January 22, 9146 5:07 PM    Additional Follow-up for Phone Call Additional follow up Details #2::    patient states she went to er and got tdap, and called animal control and dogs were up to date  place healing up Follow-up by: Adella Hare LPN,  January 23, 8294 9:11 AM  New/Updated Medications: AUGMENTIN 875-125 MG TABS (AMOXICILLIN-POT CLAVULANATE) Take 1 tablet by mouth two times a day Prescriptions: AUGMENTIN 875-125 MG TABS (AMOXICILLIN-POT CLAVULANATE) Take 1 tablet by mouth two times a day  #20 x 0   Entered and Authorized by:   Syliva Overman MD   Signed by:   Syliva Overman MD on 01/22/2010   Method used:   Electronically to        [facility]  [address] (retail)       [address]       [place], [place]  [address]       Ph: [phone]       Fax: [phone]   RxID:   [id number]

## 2010-09-18 NOTE — Letter (Signed)
Summary: [facility] FORM  [facility] MEDICAL FORM   Imported By: Lind Guest 05/30/2010 11:18:11  _____________________________________________________________________  External Attachment:    Type:   Image     Comment:   External Document

## 2010-09-18 NOTE — Assessment & Plan Note (Signed)
Summary: F UP   Vital Signs:  Patient profile:   25 year old female Menstrual status:  regular Height:      65.5 inches Weight:      224.75 pounds BMI:     36.96 O2 Sat:      100 % on Room air Pulse rate:   98 / minute Pulse rhythm:   regular Resp:     16 per minute BP sitting:   100 / 68  (left arm)  Vitals Entered By: Adella Hare LPN (March 15, 2010 11:19 AM)  Nutrition Counseling: Patient's BMI is greater than 25 and therefore counseled on weight management options.  O2 Flow:  Room air CC: follow-up visit Is Patient Diabetic? No Pain Assessment Patient in pain? no        Primary Care Provider:  Syliva Overman MD  CC:  follow-up visit.  History of Present Illness: Reports  that she has been doiing well. Denies recent fever or chills. Denies sinus pressure, nasal congestion , ear pain or sore throat. Denies chest congestion, or cough productive of sputum. Denies chest pain, palpitations, PND, orthopnea or leg swelling. Denies abdominal pain, nausea, vomitting, diarrhea or constipation. Denies change in bowel movements or bloody stool. Denies dysuria , frequency, incontinence or hesitancy. Denies  joint pain, swelling, or reduced mobility. Denies headaches, vertigo, seizures. Denies depression, anxiety or insomnia.Reports marked symptom improvement on fluoxetine. She has not been exercising and has not commited to ditary change and has gained weight. 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  Denies  rash, lesions, or itch.     Current Medications (verified): 1)  Ortho-Novum 7/7/7 (28) 0.5/0.75/1-35 Mg-Mcg  Tabs (Norethin-Eth Estrad Triphasic) .... Take 1 Tablet By Mouth Once A Day 2)  Fluoxetine Hcl 10 Mg Caps (Fluoxetine Hcl) .... Take 1 Tablet By Mouth Once A Day  Allergies (verified): No Known Drug Allergies  Review of Systems      See HPI General:  Complains of fatigue. Eyes:  Denies blurring, discharge, eye pain, and red  eye. Psych:  Complains of anxiety and depression; denies mental problems, panic attacks, sense of great danger, suicidal thoughts/plans, and thoughts of violence; marked improvement in symptoms. Endo:  Denies excessive hunger and excessive thirst. Heme:  Denies abnormal bruising and bleeding. Allergy:  Denies hives or rash and itching eyes.  Physical Exam  General:  Well-developed,obese,in no acute distress; alert,appropriate and cooperative throughout examination HEENT: No facial asymmetry,  EOMI, No sinus tenderness, TM's Clear, oropharynx  pink and moist.   Chest: Clear to auscultation bilaterally.  CVS: S1, S2, No murmurs, No S3.   Abd: Soft, Nontender.  MS: Adequate ROM spine, hips, shoulders and knees.  Ext: No edema.   CNS: CN 2-12 intact, power tone and sensation normal throughout.   Skin: Intact, no visible lesions or rashes.  Psych: Good eye contact, normal affect.  Memory intact, not anxious or depressed appearing.    Impression & Recommendations:  Problem # 1:  DEPRESSION, MILD (ICD-311) Assessment Improved  Her updated medication list for this problem includes:    Fluoxetine Hcl 10 Mg Caps (Fluoxetine hcl) .Marland Kitchen... Take 1 tablet by mouth once a day  Problem # 2:  OBESITY (ICD-278.00) Assessment: Unchanged  Ht: 65.5 (03/15/2010)   Wt: 224.75 (03/15/2010)   BMI: 36.96 (03/15/2010)  Complete Medication List: 1)  Ortho-novum 7/7/7 (28) 0.5/0.75/1-35 Mg-mcg Tabs (Norethin-eth estrad triphasic) .... Take 1 tablet by mouth once a day 2)  Fluoxetine Hcl  10 Mg Caps (Fluoxetine hcl) .... Take 1 tablet by mouth once a day  Patient Instructions: 1)  Please schedule a follow-up appointment in 2 months. 2)  It is important that you exercise regularly at least 20 minutes 5 times a week. If you develop chest pain, have severe difficulty breathing, or feel very tired , stop exercising immediately and seek medical attention. 3)  You need to lose weight. Consider a lower calorie diet  and regular exercise. ls follow a diet

## 2010-09-18 NOTE — Letter (Signed)
Summary: progress notes  progress notes   Imported By: Curtis Sites 02/07/2010 12:21:49  _____________________________________________________________________  External Attachment:    Type:   Image     Comment:   External Document

## 2010-09-18 NOTE — Letter (Signed)
Summary: phone notes  phone notes   Imported By: Curtis Sites 02/07/2010 12:21:32  _____________________________________________________________________  External Attachment:    Type:   Image     Comment:   External Document

## 2010-09-18 NOTE — Letter (Signed)
Summary: history and physical  history and physical   Imported By: Curtis Sites 02/07/2010 12:20:26  _____________________________________________________________________  External Attachment:    Type:   Image     Comment:   External Document

## 2010-09-18 NOTE — Letter (Signed)
Summary: xray  xray   Imported By: Curtis Sites 02/07/2010 12:22:05  _____________________________________________________________________  External Attachment:    Type:   Image     Comment:   External Document

## 2010-09-18 NOTE — Assessment & Plan Note (Signed)
Summary: PHY   Vital Signs:  Patient profile:   25 year old female Menstrual status:  regular Height:      65.5 inches Weight:      207 pounds BMI:     34.05 O2 Sat:      98 % Pulse rate:   101 / minute Pulse rhythm:   regular Resp:     16 per minute BP sitting:   104 / 78 Cuff size:   large  Vitals Entered By: Everitt Amber (August 21, 2009 8:37 AM)  Nutrition Counseling: Patient's BMI is greater than 25 and therefore counseled on weight management options.  Vision Screening:Left eye w/o correction: 20 / 20 Right Eye w/o correction: 20 / 20 Both eyes w/o correction:  20/ 15  Color vision testing: normal      Vision Entered By: Everitt Amber (August 21, 2009 8:40 AM)   Primary Care Provider:  Syliva Overman MD   History of Present Illness: Reports  that she has been doing well. Denies recent fever or chills. Denies sinus pressure, nasal congestion , ear pain or sore throat. Denies chest congestion, or cough productive of sputum. Denies chest pain, palpitations, PND, orthopnea or leg swelling. Denies abdominal pain, nausea, vomitting, diarrhea or constipation. Denies change in bowel movements or bloody stool. Denies dysuria , frequency, incontinence or hesitancy. Denies  joint pain, swelling, or reduced mobility. Denies headaches, vertigo, seizures. Denies depression, anxiety or insomnia. Denies  rash, lesions, or itch. She is concerned aboutr her failure to lose weight. She still is in nop mregular exercise pattern, and her eating is still uncontrolled.She no longer wants the phentermine as she has not used it to her advantage.     Current Medications (verified): 1)  Ortho-Novum 7/7/7 (28) 0.5/0.75/1-35 Mg-Mcg  Tabs (Norethin-Eth Estrad Triphasic) .... Take 1 Tablet By Mouth Once A Day  Allergies (verified): No Known Drug Allergies  Review of Systems      See HPI Eyes:  Denies blurring, discharge, and red eye. Neuro:  Denies headaches, seizures, and  sensation of room spinning. Heme:  Denies abnormal bruising and bleeding. Allergy:  Denies hives or rash and itching eyes.  Physical Exam  General:  Well-developed,obese,in no acute distress; alert,appropriate and cooperative throughout examination Head:  Normocephalic and atraumatic without obvious abnormalities. No apparent alopecia or balding. Eyes:  No corneal or conjunctival inflammation noted. EOMI. Perrla. Funduscopic exam benign, without hemorrhages, exudates or papilledema. Vision grossly normal. Ears:  External ear exam shows no significant lesions or deformities.  Otoscopic examination reveals clear canals, tympanic membranes are intact bilaterally without bulging, retraction, inflammation or discharge. Hearing is grossly normal bilaterally. Nose:  External nasal examination shows no deformity or inflammation. Nasal mucosa are pink and moist without lesions or exudates. Mouth:  Oral mucosa and oropharynx without lesions or exudates.  Teeth in good repair. Neck:  No deformities, masses, or tenderness noted. Chest Wall:  No deformities, masses, or tenderness noted. Breasts:  No mass, nodules, thickening, tenderness, bulging, retraction, inflamation, nipple discharge or skin changes noted.   Lungs:  Normal respiratory effort, chest expands symmetrically. Lungs are clear to auscultation, no crackles or wheezes. Heart:  Normal rate and regular rhythm. S1 and S2 normal without gallop, murmur, click, rub or other extra sounds. Abdomen:  Bowel sounds positive,abdomen soft and non-tender without masses, organomegaly or hernias noted. Genitalia:  Normal introitus for age, no external lesions, no vaginal discharge, mucosa pink and moist, no vaginal or cervical lesions, no vaginal  atrophy, no friaility or hemorrhage, normal uterus size and position, no adnexal masses or tenderness Msk:  No deformity or scoliosis noted of thoracic or lumbar spine.   Pulses:  R and L carotid,radial,femoral,dorsalis  pedis and posterior tibial pulses are full and equal bilaterally Extremities:  No clubbing, cyanosis, edema, or deformity noted with normal full range of motion of all joints.   Neurologic:  No cranial nerve deficits noted. Station and gait are normal. Plantar reflexes are down-going bilaterally. DTRs are symmetrical throughout. Sensory, motor and coordinative functions appear intact. Skin:  Intact without suspicious lesions or rashes Cervical Nodes:  No lymphadenopathy noted Axillary Nodes:  No palpable lymphadenopathy Inguinal Nodes:  No significant adenopathy Psych:  Cognition and judgment appear intact. Alert and cooperative with normal attention span and concentration. No apparent delusions, illusions, hallucinations   Impression & Recommendations:  Problem # 1:  ALLERGIC RHINITIS CAUSE UNSPECIFIED (ICD-477.9) Assessment Improved  The following medications were removed from the medication list:    Flonase 50 Mcg/act Susp (Fluticasone propionate) ..... One to two puffs per nostril daily  Problem # 2:  WELL WOMAN (ICD-V70.0) Assessment: Comment Only pap sent. Safe sex counselling done. Contraception refilled. Healthy diet and regular exercise discussed and encouraged.  Complete Medication List: 1)  Ortho-novum 7/7/7 (28) 0.5/0.75/1-35 Mg-mcg Tabs (Norethin-eth estrad triphasic) .... Take 1 tablet by mouth once a day  Other Orders: T-Basic Metabolic Panel [id number]) T-Lipid Profile [id number]) T-CBC w/Diff [id number]) T-TSH [id number]) Pap Smear (01027)  Patient Instructions: 1)  F/u in the next 4.5 months. 2)  It is important that you exercise regularly at least 20 minutes 5 times a week. If you develop chest pain, have severe difficulty breathing, or feel very tired , stop exercising immediately and seek medical attention. 3)  You need to lose weight. Consider a lower calorie diet and regular exercise.  4)  BMP prior to visit, ICD-9: 5)  Lipid Panel prior to  visit, ICD-9:  fasting today. 6)  TSH prior to visit, ICD-9: 7)  CBC w/ Diff prior to visit, ICD-9: 8)  i will d/c phentermine until you have started losing weight.

## 2010-09-18 NOTE — Letter (Signed)
Summary: demographic  demographic   Imported By: Curtis Sites 02/07/2010 12:20:08  _____________________________________________________________________  External Attachment:    Type:   Image     Comment:   External Document

## 2010-09-18 NOTE — Assessment & Plan Note (Signed)
Summary: FOLLOW UP   Vital Signs:  Patient profile:   25 year old female Menstrual status:  regular Height:      65.5 inches Weight:      230.75 pounds BMI:     37.95 O2 Sat:      98 % on Room air Pulse rate:   103 / minute Pulse rhythm:   regular Resp:     16 per minute BP sitting:   124 / 80  (left arm)  Vitals Entered By: Adella Hare LPN (May 22, 2010 8:53 AM)  Nutrition Counseling: Patient's BMI is greater than 25 and therefore counseled on weight management options.  O2 Flow:  Room air CC: FOLLOW UP Is Patient Diabetic? No Pain Assessment Patient in pain? no        Primary Care Johari Bennetts:  Syliva Overman MD  CC:  FOLLOW UP.  History of Present Illness: Reports  thatshe has been doing well. Denies recent fever or chills. Denies sinus pressure, nasal congestion , ear pain or sore throat. Denies chest congestion, or cough productive of sputum. Denies chest pain, palpitations, PND, orthopnea or leg swelling. Denies abdominal pain, nausea, vomitting, diarrhea or constipation. Denies change in bowel movements or bloody stool. Denies dysuria , frequency, incontinence or hesitancy. Denies  joint pain, swelling, or reduced mobility. Denies headaches, vertigo, seizures. Denies depression, anxiety or insomnia. Denies  rash, lesions, or itch.  She is concerned about continued weight gain, but denies regular exercise, and admits to continually snacking.   Current Medications (verified): 1)  Ortho-Novum 7/7/7 (28) 0.5/0.75/1-35 Mg-Mcg  Tabs (Norethin-Eth Estrad Triphasic) .... Take 1 Tablet By Mouth Once A Day  Allergies (verified): No Known Drug Allergies  Review of Systems      See HPI Eyes:  Denies blurring and discharge. Endo:  Denies excessive thirst and excessive urination. Heme:  Denies abnormal bruising and bleeding. Allergy:  Denies hives or rash and itching eyes.  Physical Exam  General:  Well-developed,obese,in no acute distress;  alert,appropriate and cooperative throughout examination HEENT: No facial asymmetry,  EOMI, No sinus tenderness, TM's Clear, oropharynx  pink and moist.   Chest: Clear to auscultation bilaterally.  CVS: S1, S2, No murmurs, No S3.   Abd: Soft, Nontender.  MS: Adequate ROM spine, hips, shoulders and knees.  Ext: No edema.   CNS: CN 2-12 intact, power tone and sensation normal throughout.   Skin: Intact, no visible lesions or rashes.  Psych: Good eye contact, normal affect.  Memory intact, not anxious or depressed appearing.    Impression & Recommendations:  Problem # 1:  OBESITY (ICD-278.00) Assessment Deteriorated  Ht: 65.5 (05/22/2010)   Wt: 230.75 (05/22/2010)   BMI: 37.95 (05/22/2010) therapeutic lifestyle change discussed and encouraged 1200 and 1500 cal diet sheets providsed, pt to resume phentermine also  Problem # 2:  DEPRESSION, MILD (ICD-311) Assessment: Improved  The following medications were removed from the medication list:    Fluoxetine Hcl 10 Mg Caps (Fluoxetine hcl) .Marland Kitchen... Take 1 tablet by mouth once a day  Problem # 3:  Preventive Health Care (ICD-V70.0) Assessment: Comment Only contraception prescribed and safe sex counselling done  Complete Medication List: 1)  Ortho-novum 7/7/7 (28) 0.5/0.75/1-35 Mg-mcg Tabs (Norethin-eth estrad triphasic) .... Take 1 tablet by mouth once a day 2)  Phentermine Hcl 37.5 Mg Tabs (Phentermine hcl) .... Take 1 tablet by mouth once a day  Patient Instructions: 1)  Please schedule a follow-up appointment in 2 months. 2)  It is important that  you exercise regularly at least 40 minutes 6 times a week. If you develop chest pain, have severe difficulty breathing, or feel very tired , stop exercising immediately and seek medical attention. 3)  You need to lose weight. Consider a lower calorie diet and regular exercise.  4)  Pls start half phentermine daily Prescriptions: PHENTERMINE HCL 37.5 MG TABS (PHENTERMINE HCL) Take 1 tablet by  mouth once a day  #30 x 0   Entered and Authorized by:   Syliva Overman MD   Signed by:   Syliva Overman MD on 05/22/2010   Method used:   Printed then faxed to ...       [facility]  [address] [facility] (retail)       [address]       [place], [place]  [address]       Ph: [phone]       Fax: [phone]   RxID:   [id number]

## 2010-09-18 NOTE — Assessment & Plan Note (Signed)
Summary: OV   Vital Signs:  Patient profile:   25 year old female Menstrual status:  regular Height:      65.5 inches Weight:      235 pounds BMI:     38.65 O2 Sat:      99 % on 98Room air Pulse rhythm:   regular Resp:     16 per minute BP sitting:   120 / 80  (right arm)  Vitals Entered By: Adella Hare LPN (July 17, 2010 2:00 PM)  Nutrition Counseling: Patient's BMI is greater than 25 and therefore counseled on weight management options.  O2 Flow:  98Room air CC: head congestion, ear ache, sore throat, cough Is Patient Diabetic? No Pain Assessment Patient in pain? no        Primary Care Provider:  Syliva Overman MD  CC:  head congestion, ear ache, sore throat, and cough.  History of Present Illness: 3 day h/o head and chest congestion wit sore throat and cough, accompanied by fever , chills and malaise. Reports  that prior to this she was doing well.  Denies chest pain, palpitations, PND, orthopnea or leg swelling. Denies abdominal pain, nausea, vomitting, diarrhea or constipation. Denies change in bowel movements or bloody stool. Denies dysuria , frequency, incontinence or hesitancy. Denies  joint pain, swelling, or reduced mobility. Denies headaches, vertigo, seizures. Denies depression, anxiety or insomnia. Denies  rash, lesions, or itch. Courtney Sims continues to gain weight and is not staying on track with the necessary lifestyle changes for weight loss.     Current Medications (verified): 1)  Ortho-Novum 7/7/7 (28) 0.5/0.75/1-35 Mg-Mcg  Tabs (Norethin-Eth Estrad Triphasic) .... Take 1 Tablet By Mouth Once A Day  Allergies (verified): No Known Drug Allergies  Review of Systems      See HPI General:  Complains of chills, fatigue, and malaise. Psych:  Denies anxiety and depression. Endo:  Denies cold intolerance, excessive hunger, excessive thirst, and excessive urination. Heme:  Denies abnormal bruising and bleeding. Allergy:  Complains of  seasonal allergies.  Physical Exam  General:  Well-developed,obese,in no acute distress; alert,appropriate and cooperative throughout examination HEENT: No facial asymmetry,  EOMI, maxillary sinus tenderness, TM's Clear, oropharynx  pink and moist. Mild erythema, no exudate  Chest: decreased air entry, bilateral crackles, no wheezes CVS: S1, S2, No murmurs, No S3.   Abd: Soft, Nontender.  MS: Adequate ROM spine, hips, shoulders and knees.  Ext: No edema.   CNS: CN 2-12 intact, power tone and sensation normal throughout.   Skin: Intact, no visible lesions or rashes.  Psych: Good eye contact, normal affect.  Memory intact, not anxious or depressed appearing.    Impression & Recommendations:  Problem # 1:  ACUTE BRONCHITIS (ICD-466.0) Assessment Comment Only  Her updated medication list for this problem includes:    Septra Ds 800-160 Mg Tabs (Sulfamethoxazole-trimethoprim) .Marland Kitchen... Take 1 tablet by mouth two times a day    Tessalon Perles 100 Mg Caps (Benzonatate) .Marland Kitchen... Take 1 capsule by mouth three times a day  Problem # 2:  OTHER ACUTE SINUSITIS (ICD-461.8) Assessment: Comment Only  Her updated medication list for this problem includes:    Septra Ds 800-160 Mg Tabs (Sulfamethoxazole-trimethoprim) .Marland Kitchen... Take 1 tablet by mouth two times a day    Tessalon Perles 100 Mg Caps (Benzonatate) .Marland Kitchen... Take 1 capsule by mouth three times a day  Problem # 3:  OBESITY (ICD-278.00) Assessment: Deteriorated  Orders: T- Hemoglobin A1C ([id number])  Ht: 65.5 (07/17/2010)   Wt: 235 (07/17/2010)  BMI: 38.65 (07/17/2010)  Problem # 4:  DEPRESSION, MILD (ICD-311) Assessment: Improved  Complete Medication List: 1)  Ortho-novum 7/7/7 (28) 0.5/0.75/1-35 Mg-mcg Tabs (Norethin-eth estrad triphasic) .... Take 1 tablet by mouth once a day 2)  Septra Ds 800-160 Mg Tabs (Sulfamethoxazole-trimethoprim) .... Take 1 tablet by mouth two times a day 3)  Tessalon Perles 100 Mg Caps (Benzonatate) .... Take 1  capsule by mouth three times a day 4)  Ibuprofen 800 Mg Tabs (Ibuprofen) .... One tab by mouth three times a day as needed 5)  Cyclobenzaprine Hcl 10 Mg Tabs (Cyclobenzaprine hcl) .... One tab by mouth at bedtime as needed  Other Orders: T-Basic Metabolic Panel [id number]) T-Lipid Profile [id number]) T-CBC w/Diff [id number]) T-TSH [id number])  Patient Instructions: 1)  CPE  January 5 or after, cancel appt for December pls. 2)  You are being treated for sinusitis and bronchitis, meds are sent to your pharmacy. 3)  Drink alot of fluids and get sufficient rest pls. 4)  Tylenol 1 every 6 to 8 hours for fever, chills or body aches 5)  BMP prior to visit, ICD-9: 6)  Lipid Panel prior to visit, ICD-9: 7)  TSH prior to visit, ICD-9:  fasting Jan 4 or after 8)  CBC w/ Diff prior to visit, ICD-9: 9)  HbgA1C prior to visit, ICD-9: Prescriptions: TESSALON PERLES 100 MG CAPS (BENZONATATE) Take 1 capsule by mouth three times a day  #21 x 0   Entered and Authorized by:   Syliva Overman MD   Signed by:   Syliva Overman MD on 07/17/2010   Method used:   Electronically to        [facility]  [address] (retail)       [address]       [place], [place]  [address]       Ph: [phone]       Fax: [phone]   RxID:   [id number] SEPTRA DS 800-160 MG TABS (SULFAMETHOXAZOLE-TRIMETHOPRIM) Take 1 tablet by mouth two times a day  #20 x 0   Entered and Authorized by:   Syliva Overman MD   Signed by:   Syliva Overman MD on 07/17/2010   Method used:   Electronically to        [facility] [address] [facility] (retail)       [address]       [place], [place]  [address]       Ph: [phone]       Fax: [phone]   RxID:   [id number]    Orders Added: 1)  Est. Patient Level IV [[address]] 2)  T-Basic Metabolic Panel [[id number]] 3)  T-Lipid Profile [[id number]] 4)  T-CBC w/Diff [[id number]] 5)  T- Hemoglobin A1C [83036-23375] 6)  T-TSH  [[id number]]

## 2010-09-18 NOTE — Letter (Signed)
Summary: consult  consult   Imported By: Curtis Sites 02/07/2010 12:19:51  _____________________________________________________________________  External Attachment:    Type:   Image     Comment:   External Document

## 2010-09-18 NOTE — Letter (Signed)
Summary: misc.  misc.   Imported By: Curtis Sites 02/07/2010 12:21:16  _____________________________________________________________________  External Attachment:    Type:   Image     Comment:   External Document

## 2010-09-26 NOTE — Letter (Signed)
Summary: Letter  Letter   Imported By: Lind Guest 09/17/2010 11:28:10  _____________________________________________________________________  External Attachment:    Type:   Image     Comment:   External Document

## 2010-09-26 NOTE — Assessment & Plan Note (Signed)
Summary: PHY   Vital Signs:  Patient profile:   25 year old female Menstrual status:  regular Height:      65.5 inches Weight:      232.25 pounds BMI:     38.20 O2 Sat:      97 % on Room air Pulse rate:   101 / minute Pulse rhythm:   regular Resp:     16 per minute BP sitting:   122 / 70  (left arm)  Vitals Entered By: Adella Hare LPN (September 13, 2010 8:31 AM)  Nutrition Counseling: Patient's BMI is greater than 25 and therefore counseled on weight management options.  O2 Flow:  Room air CC: physical Is Patient Diabetic? No  Vision Screening:Left eye w/o correction: 20 / 20 Right Eye w/o correction: 20 / 25 Both eyes w/o correction:  20/ 20        Vision Entered By: Adella Hare LPN (September 13, 2010 8:33 AM)   Primary Care Provider:  Syliva Overman MD  CC:  physical.  History of Present Illness: Reports  that she ha been doing well. Denies recent fever or chills. Denies sinus pressure, nasal congestion , ear pain or sore throat. Denies chest congestion, or cough productive of sputum. Denies chest pain, palpitations, PND, orthopnea or leg swelling. Denies abdominal pain, nausea, vomitting, diarrhea or constipation. Denies change in bowel movements or bloody stool. Denies dysuria , frequency, incontinence or hesitancy.  Denies headaches, vertigo, seizures. Denies depression, anxiety or insomnia.      Current Medications (verified): 1)  Ortho-Novum 7/7/7 (28) 0.5/0.75/1-35 Mg-Mcg  Tabs (Norethin-Eth Estrad Triphasic) .... Take 1 Tablet By Mouth Once A Day  Allergies (verified): No Known Drug Allergies  Review of Systems      See HPI Eyes:  Denies blurring, discharge, double vision, eye irritation, and eye pain. MS:  Complains of joint pain and stiffness; left knee pain swelling and point tenderness x1week. Derm:  Complains of rash; rash on left ankle x 1 week. Endo:  Denies cold intolerance, excessive hunger, excessive thirst, excessive urination,  and heat intolerance. Heme:  Denies abnormal bruising and bleeding. Allergy:  Denies hives or rash, itching eyes, persistent infections, and seasonal allergies.  Physical Exam  General:  Well-developed,obese,in no acute distress; alert,appropriate and cooperative throughout examination Head:  Normocephalic and atraumatic without obvious abnormalities. No apparent alopecia or balding. Eyes:  vision grossly intact, pupils equal, pupils round, and pupils reactive to light.   Ears:  External ear exam shows no significant lesions or deformities.  Otoscopic examination reveals clear canals, tympanic membranes are intact bilaterally without bulging, retraction, inflammation or discharge. Hearing is grossly normal bilaterally. Nose:  External nasal examination shows no deformity or inflammation. Nasal mucosa are pink and moist without lesions or exudates. Mouth:  Oral mucosa and oropharynx without lesions or exudates.  Teeth in good repair. Neck:  No deformities, masses, or tenderness noted. Chest Wall:  No deformities, masses, or tenderness noted. Breasts:  No mass, nodules, thickening, tenderness, bulging, retraction, inflamation, nipple discharge or skin changes noted.   Lungs:  Normal respiratory effort, chest expands symmetrically. Lungs are clear to auscultation, no crackles or wheezes. Heart:  Normal rate and regular rhythm. S1 and S2 normal without gallop, murmur, click, rub or other extra sounds. Abdomen:  Bowel sounds positive,abdomen soft and non-tender without masses, organomegaly or hernias noted. Genitalia:  Normal introitus for age, no external lesions, no vaginal discharge, mucosa pink and moist, no vaginal or cervical lesions, no vaginal atrophy,  no friaility or hemorrhage, normal uterus size and position, no adnexal masses or tenderness Msk:  No deformity or scoliosis noted of thoracic or lumbar spine.   Pulses:  R and L carotid,radial,femoral,dorsalis pedis and posterior tibial pulses  are full and equal bilaterally Extremities:  No clubbing, cyanosis, edema, or deformity noted with normal full range of motion of all joints. left knee mildly swollen with point tendrness on lateral aspect  Neurologic:  No cranial nerve deficits noted. Station and gait are normal. Plantar reflexes are down-going bilaterally. DTRs are symmetrical throughout. Sensory, motor and coordinative functions appear intact. Skin:  erythematous macular rash on dorsum of left foot, blanches, no scaling Cervical Nodes:  No lymphadenopathy noted Axillary Nodes:  No palpable lymphadenopathy Inguinal Nodes:  No significant adenopathy Psych:  Cognition and judgment appear intact. Alert and cooperative with normal attention span and concentration. No apparent delusions, illusions, hallucinations   Impression & Recommendations:  Problem # 1:  KNEE PAIN, LEFT, CHRONIC (ICD-719.46) Assessment Deteriorated  The following medications were removed from the medication list:    Ibuprofen 800 Mg Tabs (Ibuprofen) ..... One tab by mouth three times a day as needed    Cyclobenzaprine Hcl 10 Mg Tabs (Cyclobenzaprine hcl) ..... One tab by mouth at bedtime as needed Her updated medication list for this problem includes:    Ibuprofen 800 Mg Tabs (Ibuprofen) .Marland Kitchen... Take 1 tablet by mouth three times a day  Orders: Depo- Medrol 80mg  (J1040) Ketorolac-Toradol 15mg  (E4540) Admin of Therapeutic Inj  intramuscular or subcutaneous (98119)  Problem # 2:  PHYSICAL EXAMINATION (ICD-V70.0) Assessment: Comment Only  Orders: T-Lipid Profile (14782-95621) T- Hemoglobin A1C (30865-78469) T-Anemia Panel 3  (2904) safe sex, seatbelt use , regular exercise and dietary change addressed. Immunization is up to date  Problem # 3:  DERMATITIS (ICD-692.9) Assessment: Comment Only  Her updated medication list for this problem includes:    Prednisone (pak) 5 Mg Tabs (Prednisone) ..... Use as directed pt reassured that rash does not  appear to be de to infection and it should be watched,it blanches with pressure and I believe it represents dilated blood vessels  Complete Medication List: 1)  Ortho-novum 7/7/7 (28) 0.5/0.75/1-35 Mg-mcg Tabs (Norethin-eth estrad triphasic) .... Take 1 tablet by mouth once a day 2)  Ibuprofen 800 Mg Tabs (Ibuprofen) .... Take 1 tablet by mouth three times a day 3)  Prednisone (pak) 5 Mg Tabs (Prednisone) .... Use as directed  Other Orders: Pap Smear (62952)  Patient Instructions: 1)  Follow up appointment in 5.12months 2)  It is important that you exercise regularly at least 30 minutes 7 times a week. If you develop chest pain, have severe difficulty breathing, or feel very tired , stop exercising immediately and seek medical attention. 3)  You need to lose weight. Consider a lower calorie diet and regular exercise. Goal of 6 to 8 pounds. 4)  Injections today and med for left knee pain, call if no better, I will refer you to ortho 5)  Lipid Panel prior to visit, ICD-9:   fasting in 5.5 months 6)  HbgA1C prior to visit, ICD-9: 7)  All the best for 2012! Prescriptions: ORTHO-NOVUM 7/7/7 (28) 0.5/0.75/1-35 MG-MCG  TABS (NORETHIN-ETH ESTRAD TRIPHASIC) Take 1 tablet by mouth once a day  #3 x 3   Entered by:   Adella Hare LPN   Authorized by:   Syliva Overman MD   Signed by:   Adella Hare LPN on 84/13/2440   Method used:   Electronically  to        [facility] [address] (retail)       [address]       [place], [place]  [address]       Ph: [phone]       Fax: [phone]   RxID:   [id number] PREDNISONE (PAK) 5 MG TABS (PREDNISONE) Use as directed  #21 x 0   Entered and Authorized by:   Syliva Overman MD   Signed by:   Syliva Overman MD on 09/13/2010   Method used:   Electronically to        [facility]  [address] (retail)       [address]       [place], [place]  [address]       Ph: [phone]       Fax: [phone]   RxID:    [id number] IBUPROFEN 800 MG TABS (IBUPROFEN) Take 1 tablet by mouth three times a day  #30 x 0   Entered and Authorized by:   Syliva Overman MD   Signed by:   Syliva Overman MD on 09/13/2010   Method used:   Electronically to        [facility]  [address] (retail)       [address]       [place], [place]  [address]       Ph: [phone]       Fax: [phone]   RxID:   [id number]    Medication Administration  Injection # 1:    Medication: Depo- Medrol 80mg     Diagnosis: KNEE PAIN, LEFT, CHRONIC (ICD-719.46)    Route: IM    Site: RUOQ gluteus    Exp Date: 07/12    Lot #: [id number]    Mfr: Pharmacia    Patient tolerated injection without complications    Given by: Adella Hare LPN (September 13, 2010 9:28 AM)  Injection # 2:    Medication: Ketorolac-Toradol 15mg     Diagnosis: KNEE PAIN, LEFT, CHRONIC (ICD-719.46)    Route: IM    Site: LUOQ gluteus    Exp Date: 01/18/2012    Lot #: [id number]    Mfr: [email or web address]    Comments: toradol 60mg  given    Patient tolerated injection without complications    Given by: Adella Hare LPN (September 13, 2010 9:29 AM)  Orders Added: 1)  Est. Patient 18-39 years [99395] 2)  T-Lipid Profile [id number] 3)  T- Hemoglobin A1C [83036-23375] 4)  T-Anemia Panel 3  [2904] 5)  Pap Smear [88150] 6)  Depo- Medrol 80mg  [J1040] 7)  Ketorolac-Toradol 15mg  [J1885] 8)  Admin of Therapeutic Inj  intramuscular or subcutaneous [96372]     Medication Administration  Injection # 1:    Medication: Depo- Medrol 80mg     Diagnosis: KNEE PAIN, LEFT, CHRONIC (ICD-719.46)    Route: IM    Site: RUOQ gluteus    Exp Date: 07/12    Lot #: [id number]    Mfr: Pharmacia    Patient tolerated injection without complications    Given by: Adella Hare LPN (September 13, 2010 9:28 AM)  Injection # 2:    Medication: Ketorolac-Toradol 15mg     Diagnosis: KNEE PAIN, LEFT, CHRONIC (ICD-719.46)    Route: IM    Site: LUOQ gluteus    Exp Date:  01/18/2012  Lot #: [id number]    Mfr: [email or web address]    Comments: toradol 60mg  given    Patient tolerated injection without complications    Given by: Adella Hare LPN (September 13, 2010 9:29 AM)  Orders Added: 1)  Est. Patient 18-39 years [99395] 2)  T-Lipid Profile [id number] [id number] 3)  T- Hemoglobin A1C [83036-23375] 4)  T-Anemia Panel 3  [2904] 5)  Pap Smear [88150] 6)  Depo- Medrol 80mg  [J1040] 7)  Ketorolac-Toradol 15mg  [J1885] 8)  Admin of Therapeutic Inj  intramuscular or subcutaneous [96295]

## 2010-10-12 ENCOUNTER — Telehealth: Payer: Self-pay | Admitting: Family Medicine

## 2010-10-16 NOTE — Progress Notes (Signed)
Summary: CONGESTED  Phone Note Call from Patient   Summary of Call: PATIENT CALLED AND STATED THAT SHE IS CONGESTED  CLEAR FEEL IT DRAINING THROUGH HER EARS. COULD SHE HAVE SOMETHING CALLED INTO [facility] IN [place]. PLEASE CALL HER AND LET HER KNOW   Initial call taken by: Lind Guest,  October 12, 2010 8:35 AM  Follow-up for Phone Call        no fever, chills, or body aches advised patient otc decongestant if develops symptoms of infection needs ov patient agrees Follow-up by: Adella Hare LPN,  October 12, 2010 8:50 AM

## 2010-10-29 ENCOUNTER — Ambulatory Visit (INDEPENDENT_AMBULATORY_CARE_PROVIDER_SITE_OTHER): Payer: Self-pay | Admitting: Family Medicine

## 2010-10-29 ENCOUNTER — Encounter: Payer: Self-pay | Admitting: Family Medicine

## 2010-10-29 DIAGNOSIS — J019 Acute sinusitis, unspecified: Secondary | ICD-10-CM

## 2010-10-29 DIAGNOSIS — J209 Acute bronchitis, unspecified: Secondary | ICD-10-CM

## 2010-11-04 DIAGNOSIS — J019 Acute sinusitis, unspecified: Secondary | ICD-10-CM | POA: Insufficient documentation

## 2010-11-06 NOTE — Letter (Signed)
Summary: Out of Work  [facility]  [address]   [place], [place] [address]   Phone: [phone]  Fax: [phone]    October 29, 2010   Employee:  ARTIE TAKAYAMA [employer] [employer] [facility] [employer] [facility]    To Whom It May Concern:   For Medical reasons, please excuse the above named employee from work for the following dates:  Start:   10/29/10  End:   10/31/10 to return with no restrictions  If you need additional information, please feel free to contact our office.         Sincerely,    Milus Mallick. Lodema Hong, MD

## 2010-11-15 NOTE — Assessment & Plan Note (Signed)
Summary: office visit   Vital Signs:  Patient profile:   25 year old female Menstrual status:  regular Height:      65.5 inches Weight:      235.25 pounds BMI:     38.69 O2 Sat:      98 % on Room air Temp:     98.4 degrees F oral Pulse rate:   83 / minute Pulse rhythm:   regular Resp:     16 per minute BP sitting:   110 / 80  (left arm)  Vitals Entered By: Adella Hare LPN (October 29, 2010 10:59 AM)  Nutrition Counseling: Patient's BMI is greater than 25 and therefore counseled on weight management options.  O2 Flow:  Room air CC: nasal drainage, headache, congestion, cough x 3days Is Patient Diabetic? No   Primary Care Provider:  Syliva Overman MD  CC:  nasal drainage, headache, congestion, and cough x 3days.  History of Present Illness: 3 day h/o head and chest congestion, generalised aches and fatigue.  Current Medications (verified): 1)  Ortho-Novum 7/7/7 (28) 0.5/0.75/1-35 Mg-Mcg  Tabs (Norethin-Eth Estrad Triphasic) .... Take 1 Tablet By Mouth Once A Day 2)  Ibuprofen 800 Mg Tabs (Ibuprofen) .... Take 1 Tablet By Mouth Three Times A Day 3)  Tessalon Perles 100 Mg Caps (Benzonatate) .... One Cap By Mouth Three Times A Day 4)  Penicillin V Potassium 500 Mg Tabs (Penicillin V Potassium) .... One Tab By Mouth Three Times A Day  Allergies (verified): No Known Drug Allergies  Review of Systems      See HPI General:  Complains of chills and fatigue; denies fever. Eyes:  Denies discharge and eye pain. ENT:  Complains of nasal congestion, postnasal drainage, and sinus pressure; denies sore throat. CV:  Denies chest pain or discomfort, difficulty breathing while lying down, palpitations, and swelling of feet. Resp:  Complains of cough and sputum productive. GI:  Denies abdominal pain, constipation, diarrhea, nausea, and vomiting. GU:  Denies dysuria and urinary frequency. MS:  Denies joint pain and stiffness. Psych:  Denies anxiety and depression.  Physical  Exam  General:  Well-developed,obesein no acute distress; alert,appropriate and cooperative throughout examination HEENT: No facial asymmetry,  EOMI, frontal  sinus tenderness, TM's Clear, oropharynx  pink and moist.   Chest:decreased air entry, bilateral crackles and few whezes CVS: S1, S2, No murmurs, No S3.   Abd: Soft, Nontender.  MS: Adequate ROM spine, hips, shoulders and knees.  Ext: No edema.   CNS: CN 2-12 intact, power tone and sensation normal throughout.   Skin: Intact, no visible lesions or rashes.  Psych: Good eye contact, normal affect.  Memory intact, not anxious or depressed appearing.    Impression & Recommendations:  Problem # 1:  ACUTE SINUSITIS, UNSPECIFIED (ICD-461.9) Assessment Comment Only  Her updated medication list for this problem includes:    Tessalon Perles 100 Mg Caps (Benzonatate) ..... One cap by mouth three times a day    Penicillin V Potassium 500 Mg Tabs (Penicillin v potassium) ..... One tab by mouth three times a day  Problem # 2:  ACUTE BRONCHITIS (ICD-466.0) Assessment: Comment Only  Her updated medication list for this problem includes:    Tessalon Perles 100 Mg Caps (Benzonatate) ..... One cap by mouth three times a day    Penicillin V Potassium 500 Mg Tabs (Penicillin v potassium) ..... One tab by mouth three times a day  Orders: Rocephin  250mg  (W1191) Admin of Therapeutic Inj  intramuscular or subcutaneous (  [address])  Problem # 3:  OBESITY (ICD-278.00) Assessment: Deteriorated  Ht: 65.5 (10/29/2010)   Wt: 235.25 (10/29/2010)   BMI: 38.69 (10/29/2010)  Complete Medication List: 1)  Ortho-novum 7/7/7 (28) 0.5/0.75/1-35 Mg-mcg Tabs (Norethin-eth estrad triphasic) .... Take 1 tablet by mouth once a day 2)  Ibuprofen 800 Mg Tabs (Ibuprofen) .... Take 1 tablet by mouth three times a day 3)  Tessalon Perles 100 Mg Caps (Benzonatate) .... One cap by mouth three times a day 4)  Penicillin V Potassium 500 Mg Tabs (Penicillin v potassium)  .... One tab by mouth three times a day  Patient Instructions: 1)  f/u as before. 2)  you are being treated for acute bronchitis andsinusits. 3)  Take your antibiotic as prescribed until ALL of it is gone, but stop if you develop a rash or swelling and contact our office as soon as possible. 4)  Take 650-1000mg  of Tylenol every 4-6 hours as needed for relief of pain or comfort of fever AVOID taking more than 4000mg   in a 24 hour period (can cause liver damage in higher doses). 5)  Take 400-600mg  of Ibuprofen (Advil, Motrin) with food every 4-6 hours as needed for relief of pain or comfort of fever.   Medication Administration  Injection # 1:    Medication: Rocephin  250mg     Diagnosis: ACUTE BRONCHITIS (ICD-466.0)    Route: IM    Site: RUOQ gluteus    Exp Date: 08/14    Lot #: [id number]    Mfr: [email or web address]    Comments: rocephin 500mg  given    Patient tolerated injection without complications    Given by: Adella Hare LPN (October 29, 2010 10:53 AM)  Orders Added: 1)  Rocephin  250mg  [J0696] 2)  Admin of Therapeutic Inj  intramuscular or subcutaneous [96372] 3)  Est. Patient Level III [60109]     Medication Administration  Injection # 1:    Medication: Rocephin  250mg     Diagnosis: ACUTE BRONCHITIS (ICD-466.0)    Route: IM    Site: RUOQ gluteus    Exp Date: 08/14    Lot #: [id number]    Mfr: [email or web address]    Comments: rocephin 500mg  given    Patient tolerated injection without complications    Given by: Adella Hare LPN (October 29, 2010 10:53 AM)  Orders Added: 1)  Rocephin  250mg  [J0696] 2)  Admin of Therapeutic Inj  intramuscular or subcutaneous [96372] 3)  Est. Patient Level III [32202]

## 2010-12-12 ENCOUNTER — Encounter: Payer: Self-pay | Admitting: Family Medicine

## 2010-12-17 ENCOUNTER — Encounter: Payer: Self-pay | Admitting: Family Medicine

## 2010-12-18 ENCOUNTER — Ambulatory Visit (INDEPENDENT_AMBULATORY_CARE_PROVIDER_SITE_OTHER): Payer: 59 | Admitting: Family Medicine

## 2010-12-18 ENCOUNTER — Encounter: Payer: Self-pay | Admitting: Family Medicine

## 2010-12-18 VITALS — BP 120/78 | HR 91 | Resp 16 | Ht 68.0 in | Wt 237.1 lb

## 2010-12-18 DIAGNOSIS — F419 Anxiety disorder, unspecified: Secondary | ICD-10-CM

## 2010-12-18 DIAGNOSIS — E669 Obesity, unspecified: Secondary | ICD-10-CM

## 2010-12-18 DIAGNOSIS — F411 Generalized anxiety disorder: Secondary | ICD-10-CM

## 2010-12-18 MED ORDER — BUSPIRONE HCL 10 MG PO TABS: 10.0000 mg | ORAL_TABLET | Freq: Two times a day (BID) | ORAL | Status: DC

## 2010-12-18 NOTE — Assessment & Plan Note (Signed)
Deteriorated, will prescribe medication

## 2010-12-18 NOTE — Progress Notes (Signed)
  Subjective:    Patient ID: Courtney Sims, female    DOB: 03-15-1986, 25 y.o.   MRN: [id number]  HPI Pt in with concerns about failure to lose weight despite increased activity, on avg 3 days per week, and healthier food choices. She does not want phentermine, has not helped in the past  She states school is going well, but c/o excessive stress, thinks medication needed. She is not depressed.    Review of Systems Denies recent fever or chills. Denies sinus pressure, nasal congestion, ear pain or sore throat. Denies chest congestion, productive cough or wheezing. Denies chest pains, palpitations, paroxysmal nocturnal dyspnea, orthopnea and leg swelling Denies abdominal pain, nausea, vomiting,diarrhea or constipation.  Denies rectal bleeding or change in bowel movement. Denies dysuria, frequency, hesitancy or incontinence.  Denies depression,  or insomnia. Denies skin break down or rash.        Objective:   Physical Exam Patient alert and oriented and in no Cardiopulmonary distress.  HEENT: No facial asymmetry, EOMI, no sinus tenderness, TM's clear, Oropharynx pink and moist.  Neck supple no adenopathy.  Chest: Clear to auscultation bilaterally.  CVS: S1, S2 no murmurs, no S3.  ABD: Soft non tender. Bowel sounds normal.  Ext: No edema  MS: Adequate ROM spine, shoulders, hips and knees.  Skin: Intact, no ulcerations or rash noted.  Psych: Good eye contact, normal affect. Memory intact not anxious or depressed appearing.  CNS: CN 2-12 intact, power, tone and sensation normal throughout.        Assessment & Plan:

## 2010-12-18 NOTE — Patient Instructions (Signed)
F/u in 3 months.  Pls make changes in eating as discussed.  You need to commit to exercise every day for  At least 30 minutes  All the best!

## 2010-12-18 NOTE — Assessment & Plan Note (Signed)
Deteriorated. Patient re-educated about  the importance of commitment to a  minimum of 150 minutes of exercise per week. The importance of healthy food choices with portion control discussed. Encouraged to start a food diary, count calories and to consider  joining a support group. Sample diet sheets offered. Goals set by the patient for the next several months.    

## 2011-01-03 ENCOUNTER — Telehealth: Payer: Self-pay | Admitting: *Deleted

## 2011-01-06 NOTE — Telephone Encounter (Signed)
i recommend she take half tab twice daily , dose reduction, if this does not work , will recommend paxil, however I suggest she give this reduced dose at least a 4 day trial

## 2011-01-07 NOTE — Telephone Encounter (Signed)
Patient aware and agrees.

## 2011-01-07 NOTE — Telephone Encounter (Signed)
Called patient, left message.

## 2011-02-07 ENCOUNTER — Telehealth: Payer: Self-pay | Admitting: Family Medicine

## 2011-02-07 NOTE — Telephone Encounter (Signed)
Healthy snack /substitue is fruit or vegetable, can also make fruit smoothie at home and put in protein powder

## 2011-02-07 NOTE — Telephone Encounter (Signed)
Dr Lodema Hong patient was referring to Buspar. She would like another med in its place.

## 2011-02-08 NOTE — Telephone Encounter (Signed)
I recommend paxil 10mg  one daily pls advise and erx #30 refill 2

## 2011-02-11 NOTE — Telephone Encounter (Signed)
Called, left message with mother to call back

## 2011-02-12 MED ORDER — PAROXETINE HCL 10 MG PO TABS: 10.0000 mg | ORAL_TABLET | Freq: Every day | ORAL | Status: DC

## 2011-02-12 NOTE — Telephone Encounter (Signed)
Addended by: Abner Greenspan on: 02/12/2011 03:37 PM   Modules accepted: Orders, Medications

## 2011-02-12 NOTE — Telephone Encounter (Signed)
Mother aware alternative called in

## 2011-03-04 ENCOUNTER — Encounter: Payer: Self-pay | Admitting: Family Medicine

## 2011-03-05 ENCOUNTER — Encounter: Payer: Self-pay | Admitting: Family Medicine

## 2011-03-05 ENCOUNTER — Ambulatory Visit (INDEPENDENT_AMBULATORY_CARE_PROVIDER_SITE_OTHER): Payer: 59 | Admitting: Family Medicine

## 2011-03-05 VITALS — BP 110/80 | HR 95 | Resp 16 | Ht 67.75 in | Wt 239.1 lb

## 2011-03-05 DIAGNOSIS — M25569 Pain in unspecified knee: Secondary | ICD-10-CM

## 2011-03-05 DIAGNOSIS — R7301 Impaired fasting glucose: Secondary | ICD-10-CM

## 2011-03-05 DIAGNOSIS — J019 Acute sinusitis, unspecified: Secondary | ICD-10-CM

## 2011-03-05 DIAGNOSIS — M79609 Pain in unspecified limb: Secondary | ICD-10-CM

## 2011-03-05 DIAGNOSIS — F329 Major depressive disorder, single episode, unspecified: Secondary | ICD-10-CM

## 2011-03-05 DIAGNOSIS — F419 Anxiety disorder, unspecified: Secondary | ICD-10-CM

## 2011-03-05 DIAGNOSIS — F411 Generalized anxiety disorder: Secondary | ICD-10-CM

## 2011-03-05 DIAGNOSIS — E669 Obesity, unspecified: Secondary | ICD-10-CM

## 2011-03-05 DIAGNOSIS — J329 Chronic sinusitis, unspecified: Secondary | ICD-10-CM

## 2011-03-05 MED ORDER — IBUPROFEN 800 MG PO TABS: 800.0000 mg | ORAL_TABLET | Freq: Three times a day (TID) | ORAL | Status: AC | PRN

## 2011-03-05 MED ORDER — METHYLPREDNISOLONE ACETATE 80 MG/ML IJ SUSP
80.0000 mg | Freq: Once | INTRAMUSCULAR | Status: AC
  Administered 2011-03-05: 80 mg via INTRAMUSCULAR

## 2011-03-05 MED ORDER — SULFAMETHOXAZOLE-TRIMETHOPRIM 800-160 MG PO TABS: 1.0000 | ORAL_TABLET | Freq: Two times a day (BID) | ORAL | Status: AC

## 2011-03-05 MED ORDER — KETOROLAC TROMETHAMINE 30 MG/ML IJ SOLN
60.0000 mg | Freq: Once | INTRAMUSCULAR | Status: AC
  Administered 2011-03-05: 60 mg via INTRAMUSCULAR

## 2011-03-05 NOTE — Assessment & Plan Note (Signed)
Acute episode , med sent in

## 2011-03-05 NOTE — Assessment & Plan Note (Signed)
Improved on paxil 

## 2011-03-05 NOTE — Assessment & Plan Note (Signed)
Deteriorated affects both knees

## 2011-03-05 NOTE — Assessment & Plan Note (Signed)
Improved, continue paxil, also less anxious

## 2011-03-05 NOTE — Patient Instructions (Addendum)
F/U in 4 months.  Weight loss goal is approx 12 to 16 pounds.  You are being referred for individual counseling.  Injections today for knee pain, and meds are also sent in for this as well as sinusitis  It is important that you exercise regularly at least 30 minutes 5 times a week. If you develop chest pain, have severe difficulty breathing, or feel very tired, stop exercising immediately and seek medical attention    A healthy diet is rich in fruit, vegetables and whole grains. Poultry fish, nuts and beans are a healthy choice for protein rather then red meat. A low sodium diet and drinking 64 ounces of water daily is generally recommended. Oils and sweet should be limited. Carbohydrates especially for those who are diabetic or overweight, should be limited to 34-45 gram per meal. It is important to eat on a regular schedule, at least 3 times daily. Snacks should be primarily fruits, vegetables or nuts.   FOOD diaries are very useful, also commit to 64 ounces water daily  HBA1C today

## 2011-03-05 NOTE — Assessment & Plan Note (Signed)
Deteriorated. Patient re-educated about  the importance of commitment to a  minimum of 150 minutes of exercise per week. The importance of healthy food choices with portion control discussed. Encouraged to start a food diary, count calories and to consider  joining a support group. Sample diet sheets offered. Goals set by the patient for the next several months.    

## 2011-03-05 NOTE — Progress Notes (Signed)
  Subjective:    Patient ID: Courtney Sims, female    DOB: [birth year], 25 y.o.   MRN: [id number]  HPI 1 week h/o facial pressure with green nasal drainage , and intermittent chills , but no documented fever. She denies productive cough. Reports less anxiety as a result of vacation and the paxil which she tolerates well. Disgusted with futile weight loss attempts but will persevere    Review of Systems Denies recent fever  Denies chest congestion, productive cough or wheezing. Denies chest pains, palpitations, paroxysmal nocturnal dyspnea, orthopnea and leg swelling Denies abdominal pain, nausea, vomiting,diarrhea or constipation.  Denies rectal bleeding or change in bowel movement. Denies dysuria, frequency, hesitancy or incontinence. Increased bilateral knee pain, requests injection and meds. Aware that weight gain is a problem. Denies headaches, seizure, numbness, or tingling. Reports improvement in depression and  anxiety  Denies skin break down or rash.        Objective:   Physical Exam Patient alert and oriented and in no Cardiopulmonary distress.  HEENT: No facial asymmetry, EOMI, maxillary  sinus tenderness, TM's clear, Oropharynx pink and moist.  Neck supple no adenopathy.  Chest: Clear to auscultation bilaterally.  CVS: S1, S2 no murmurs, no S3.  ABD: Soft non tender. Bowel sounds normal.  Ext: No edema  MS: Adequate ROM spine, shoulders, hips and reduced in  knees.  Skin: Intact, no ulcerations or rash noted.  Psych: Good eye contact, normal affect. Memory intact not anxious or depressed appearing.  CNS: CN 2-12 intact, power, tone and sensation normal throughout.        Assessment & Plan:

## 2011-03-12 ENCOUNTER — Ambulatory Visit: Payer: Self-pay | Admitting: Family Medicine

## 2011-04-04 ENCOUNTER — Telehealth: Payer: Self-pay

## 2011-04-04 DIAGNOSIS — D649 Anemia, unspecified: Secondary | ICD-10-CM

## 2011-04-04 NOTE — Telephone Encounter (Signed)
Called patient, no answer 

## 2011-04-04 NOTE — Telephone Encounter (Signed)
Since her iron levels continue to decrease, she wants to know if there is a multivitamin that has extra iron in it or one that you recommend for her?

## 2011-04-04 NOTE — Telephone Encounter (Signed)
Iron has not been checked since January, needs updated lab before any decision can be made, pls order cbc  And anemia panel let her know

## 2011-04-08 NOTE — Telephone Encounter (Signed)
Patient aware and labs ordered

## 2011-04-09 LAB — CBC WITH DIFFERENTIAL/PLATELET
Eosinophils Absolute: 0.3 10*3/uL (ref 0.0–0.7)
Eosinophils Relative: 4 % (ref 0–5)
HCT: 41.3 % (ref 36.0–46.0)
Lymphocytes Relative: 38 % (ref 12–46)
Lymphs Abs: 2.7 10*3/uL (ref 0.7–4.0)
MCH: 28 pg (ref 26.0–34.0)
MCV: 88.8 fL (ref 78.0–100.0)
Monocytes Absolute: 0.4 10*3/uL (ref 0.1–1.0)
RBC: 4.65 MIL/uL (ref 3.87–5.11)
RDW: 15 % (ref 11.5–15.5)
WBC: 7 10*3/uL (ref 4.0–10.5)

## 2011-04-09 LAB — ANEMIA PANEL
ABS Retic: 79.1 10*3/uL (ref 19.0–186.0)
Iron: 85 ug/dL (ref 42–145)

## 2011-04-11 ENCOUNTER — Other Ambulatory Visit: Payer: Self-pay

## 2011-04-11 MED ORDER — NORETHIN-ETH ESTRAD TRIPHASIC 0.5/0.75/1-35 MG-MCG PO TABS: 1.0000 | ORAL_TABLET | Freq: Every day | ORAL | Status: DC

## 2011-06-05 ENCOUNTER — Telehealth: Payer: Self-pay | Admitting: Family Medicine

## 2011-06-06 ENCOUNTER — Telehealth: Payer: Self-pay | Admitting: Family Medicine

## 2011-06-06 ENCOUNTER — Encounter: Payer: Self-pay | Admitting: Family Medicine

## 2011-06-06 NOTE — Telephone Encounter (Signed)
Advise use of otc preps if worsens urgent care, normal saline flusses every 2 to 3 hours, claritin d or zyrtec d once daily

## 2011-06-06 NOTE — Telephone Encounter (Signed)
Patient aware but she wants to make an appointment because she is really stopped up today

## 2011-06-07 ENCOUNTER — Encounter: Payer: Self-pay | Admitting: Family Medicine

## 2011-06-07 ENCOUNTER — Ambulatory Visit (INDEPENDENT_AMBULATORY_CARE_PROVIDER_SITE_OTHER): Payer: 59 | Admitting: Family Medicine

## 2011-06-07 VITALS — BP 110/70 | HR 127 | Temp 98.7°F | Resp 16 | Ht 67.75 in | Wt 238.0 lb

## 2011-06-07 DIAGNOSIS — E669 Obesity, unspecified: Secondary | ICD-10-CM

## 2011-06-07 DIAGNOSIS — R7301 Impaired fasting glucose: Secondary | ICD-10-CM

## 2011-06-07 DIAGNOSIS — J329 Chronic sinusitis, unspecified: Secondary | ICD-10-CM

## 2011-06-07 DIAGNOSIS — J40 Bronchitis, not specified as acute or chronic: Secondary | ICD-10-CM | POA: Insufficient documentation

## 2011-06-07 DIAGNOSIS — R5381 Other malaise: Secondary | ICD-10-CM

## 2011-06-07 MED ORDER — BENZONATATE 100 MG PO CAPS: 100.0000 mg | ORAL_CAPSULE | Freq: Three times a day (TID) | ORAL | Status: DC | PRN

## 2011-06-07 MED ORDER — ALBUTEROL SULFATE (2.5 MG/3ML) 0.083% IN NEBU: 2.5000 mg | INHALATION_SOLUTION | Freq: Once | RESPIRATORY_TRACT | Status: DC

## 2011-06-07 MED ORDER — CEFTRIAXONE SODIUM 500 MG IJ SOLR
500.0000 mg | Freq: Once | INTRAMUSCULAR | Status: AC
  Administered 2011-06-07: 500 mg via INTRAMUSCULAR

## 2011-06-07 MED ORDER — SULFAMETHOXAZOLE-TRIMETHOPRIM 800-160 MG PO TABS: 1.0000 | ORAL_TABLET | Freq: Two times a day (BID) | ORAL | Status: AC

## 2011-06-07 MED ORDER — IPRATROPIUM BROMIDE 0.02 % IN SOLN: 0.5000 mg | Freq: Once | RESPIRATORY_TRACT | Status: DC

## 2011-06-07 MED ORDER — LORATADINE 10 MG PO TABS: 10.0000 mg | ORAL_TABLET | Freq: Every day | ORAL | Status: DC

## 2011-06-07 NOTE — Telephone Encounter (Signed)
Patient seen in office

## 2011-06-07 NOTE — Patient Instructions (Addendum)
CPE January 27 or after.  You are being treated for bronchitis and sinusitis  You will get Rocephin and a neb treatment in the office.  Septra for 10 days is sent in also decongestant tablets  Fasting labs Jan 24 or after LABWORK  NEEDS TO BE DONE BETWEEN 3 TO 7 DAYS BEFORE YOUR NEXT SCEDULED  VISIT.  THIS WILL IMPROVE THE QUALITY OF YOUR CARE.

## 2011-06-08 NOTE — Assessment & Plan Note (Signed)
Neb treatment in office and antbiotics

## 2011-06-08 NOTE — Assessment & Plan Note (Signed)
Acute sinusitis, rocephin administered and septra prescribed

## 2011-06-08 NOTE — Assessment & Plan Note (Signed)
unchanged Patient re-educated about  the importance of commitment to a  minimum of 150 minutes of exercise per week. The importance of healthy food choices with portion control discussed. Encouraged to start a food diary, count calories and to consider  joining a support group. Sample diet sheets offered. Goals set by the patient for the next several months.    

## 2011-06-08 NOTE — Progress Notes (Signed)
  Subjective:    Patient ID: Courtney Sims, female    DOB: [birth year], 25 y.o.   MRN: [id number]  HPI 3 day h/o acute head and chest congestion with chills, no documented fever. Pt also has bilateral ear pressure , and has coughed up green sputum.   Review of Systems See HPI hroat. Denies chest congestion, productive cough or wheezing. Denies chest pains, palpitations and leg swelling Denies abdominal pain, nausea, vomiting,diarrhea or constipation.   Denies dysuria, frequency, hesitancy or incontinence. Denies joint pain, swelling and limitation in mobility. Denies headaches, seizures, numbness, or tingling. Denies depression, anxiety or insomnia. Denies skin break down or rash.        Objective:   Physical Exam Patient alert and oriented and in no cardiopulmonary distress.  HEENT: No facial asymmetry, EOMI,maxillary  sinus tenderness,  oropharynx erythematous  and moist.  Neck supple bilateral adenopathy.  Chest: decreased air entry, bilateral crackles, no wheezes CVS: S1, S2 no murmurs, no S3.  ABD: Soft non tender. Bowel sounds normal.  Ext: No edema  MS: Adequate ROM spine, shoulders, hips and knees.  Skin: Intact, no ulcerations or rash noted.  Psych: Good eye contact, normal affect. Memory intact not anxious or depressed appearing.  CNS: CN 2-12 intact, power, tone and sensation normal throughout.        Assessment & Plan:

## 2011-07-09 ENCOUNTER — Ambulatory Visit: Payer: 59 | Admitting: Family Medicine

## 2011-08-22 ENCOUNTER — Telehealth: Payer: Self-pay | Admitting: Family Medicine

## 2011-08-22 NOTE — Telephone Encounter (Signed)
Aware and told front staff that if anyone cancelled for Friday to work her in.

## 2011-08-22 NOTE — Telephone Encounter (Signed)
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

## 2011-08-22 NOTE — Telephone Encounter (Signed)
Wants to be worked in tomorrow or something sent in for sinus infection (greenish yellow mucus)

## 2011-08-22 NOTE — Telephone Encounter (Signed)
Called and left message with her mother that I was returning her call

## 2011-08-23 NOTE — Telephone Encounter (Signed)
No one cancelled for Friday. Pt couldn't get in

## 2011-09-09 ENCOUNTER — Encounter: Payer: Self-pay | Admitting: Family Medicine

## 2011-09-09 ENCOUNTER — Ambulatory Visit (INDEPENDENT_AMBULATORY_CARE_PROVIDER_SITE_OTHER): Payer: 59 | Admitting: Family Medicine

## 2011-09-09 VITALS — BP 122/70 | HR 108 | Resp 18 | Ht 67.5 in | Wt 250.0 lb

## 2011-09-09 DIAGNOSIS — E669 Obesity, unspecified: Secondary | ICD-10-CM

## 2011-09-09 DIAGNOSIS — Z0189 Encounter for other specified special examinations: Secondary | ICD-10-CM

## 2011-09-09 DIAGNOSIS — R5381 Other malaise: Secondary | ICD-10-CM

## 2011-09-09 DIAGNOSIS — Z0000 Encounter for general adult medical examination without abnormal findings: Secondary | ICD-10-CM

## 2011-09-09 DIAGNOSIS — F419 Anxiety disorder, unspecified: Secondary | ICD-10-CM

## 2011-09-09 DIAGNOSIS — M549 Dorsalgia, unspecified: Secondary | ICD-10-CM | POA: Insufficient documentation

## 2011-09-09 DIAGNOSIS — R5383 Other fatigue: Secondary | ICD-10-CM

## 2011-09-09 DIAGNOSIS — F411 Generalized anxiety disorder: Secondary | ICD-10-CM

## 2011-09-09 DIAGNOSIS — F329 Major depressive disorder, single episode, unspecified: Secondary | ICD-10-CM

## 2011-09-09 MED ORDER — CALCIUM CARBONATE-VITAMIN D 500-200 MG-UNIT PO TABS: 1.0000 | ORAL_TABLET | Freq: Every day | ORAL | Status: DC

## 2011-09-09 MED ORDER — IBUPROFEN 800 MG PO TABS: 800.0000 mg | ORAL_TABLET | Freq: Three times a day (TID) | ORAL | Status: AC | PRN

## 2011-09-09 MED ORDER — CYCLOBENZAPRINE HCL 10 MG PO TABS: ORAL_TABLET | ORAL | Status: AC

## 2011-09-09 NOTE — Patient Instructions (Signed)
CPE to be rescheduled for March, pt to determine date, cancel appt next week.  Ibuprofen and muscle relaxers are prescribed for upper back pain and spasm  Fasting CBC, chem 7 , lipid, TSH and hBa1C and vit D first week in Feb   Use "my fitness pal" to count calories, and commit to regular exercise, this will make you lose weight.  All the Best!!!

## 2011-09-10 LAB — CBC WITH DIFFERENTIAL/PLATELET
Basophils Absolute: 0 10*3/uL (ref 0.0–0.1)
Basophils Relative: 0 % (ref 0–1)
HCT: 37.6 % (ref 36.0–46.0)
MCHC: 30.3 g/dL (ref 30.0–36.0)
Monocytes Absolute: 0.3 10*3/uL (ref 0.1–1.0)
Neutro Abs: 3.2 10*3/uL (ref 1.7–7.7)
Neutrophils Relative %: 54 % (ref 43–77)
Platelets: 283 10*3/uL (ref 150–400)
RDW: 14.7 % (ref 11.5–15.5)

## 2011-09-10 LAB — BASIC METABOLIC PANEL
BUN: 10 mg/dL (ref 6–23)
CO2: 23 mEq/L (ref 19–32)
Calcium: 9.1 mg/dL (ref 8.4–10.5)
Creat: 0.64 mg/dL (ref 0.50–1.10)
Glucose, Bld: 93 mg/dL (ref 70–99)

## 2011-09-10 LAB — LIPID PANEL
HDL: 39 mg/dL — ABNORMAL LOW (ref >39)
Total CHOL/HDL Ratio: 4.8 Ratio
Triglycerides: 186 mg/dL — ABNORMAL HIGH (ref <150)

## 2011-09-10 LAB — HEMOGLOBIN A1C: Hgb A1c MFr Bld: 5.8 % — ABNORMAL HIGH (ref <5.7)

## 2011-09-16 NOTE — Assessment & Plan Note (Signed)
Acute symptom onset  inflammatory and muscle relaxants prescribed, pt to call back if no improvement

## 2011-09-16 NOTE — Assessment & Plan Note (Signed)
Controlled, no change in medication  

## 2011-09-16 NOTE — Assessment & Plan Note (Signed)
Deteriorated. Patient re-educated about  the importance of commitment to a  minimum of 150 minutes of exercise per week. The importance of healthy food choices with portion control discussed. Encouraged to start a food diary, count calories and to consider  joining a support group. Sample diet sheets offered. Goals set by the patient for the next several months.    

## 2011-09-16 NOTE — Assessment & Plan Note (Signed)
Marked improvement , also schoolwork has greatly improved

## 2011-09-16 NOTE — Progress Notes (Signed)
  Subjective:    Patient ID: Courtney Sims, female    DOB: 04-May-1986, 26 y.o.   MRN: [id number]  HPI The PT is here for follow up and re-evaluation of chronic medical conditions, medication management and review of any available recent lab and radiology data.  Preventive health is updated, specifically  Cancer screening and Immunization.   Questions or concerns regarding consultations or procedures which the PT has had in the interim are  addressed. The PT denies any adverse reactions to current medications since the last visit.  Concerned about continued weight gain, also recent mid back pain with spasm with no specific injury to account for symptoms . Exercise is still not regular and eating habits need to be more streamlined      Review of Systems See HPI Denies recent fever or chills. Denies sinus pressure, nasal congestion, ear pain or sore throat. Denies chest congestion, productive cough or wheezing. Denies chest pains, palpitations and leg swelling Denies abdominal pain, nausea, vomiting,diarrhea or constipation.   Denies dysuria, frequency, hesitancy or incontinence.  Denies headaches, seizures, numbness, or tingling. Denies depression, anxiety or insomnia. Denies skin break down or rash.        Objective:   Physical Exam  Patient alert and oriented and in no cardiopulmonary distress.  HEENT: No facial asymmetry, EOMI, no sinus tenderness,  oropharynx pink and moist.  Neck supple no adenopathy.  Chest: Clear to auscultation bilaterally.  CVS: S1, S2 no murmurs, no S3.  ABD: Soft non tender. Bowel sounds normal.  Ext: No edema  MS: decreased thoracic  ROM spine with spasm,adequate in  shoulders, hips and knees.  Skin: Intact, no ulcerations or rash noted.  Psych: Good eye contact, normal affect. Memory intact not anxious or depressed appearing.  CNS: CN 2-12 intact, power, tone and sensation normal throughout.       Assessment & Plan:

## 2011-09-18 ENCOUNTER — Encounter: Payer: 59 | Admitting: Family Medicine

## 2011-10-18 ENCOUNTER — Telehealth: Payer: Self-pay | Admitting: Family Medicine

## 2011-10-18 NOTE — Telephone Encounter (Signed)
Does she need anymore labs done?

## 2011-10-21 ENCOUNTER — Ambulatory Visit (INDEPENDENT_AMBULATORY_CARE_PROVIDER_SITE_OTHER): Payer: 59 | Admitting: Family Medicine

## 2011-10-21 ENCOUNTER — Encounter: Payer: Self-pay | Admitting: Family Medicine

## 2011-10-21 ENCOUNTER — Other Ambulatory Visit (HOSPITAL_COMMUNITY)
Admission: RE | Admit: 2011-10-21 | Discharge: 2011-10-21 | Disposition: A | Discharge status: Home / Self Care | Payer: 59 | Source: Ambulatory Visit | Attending: Family Medicine | Admitting: Family Medicine

## 2011-10-21 VITALS — BP 118/74 | HR 75 | Resp 18 | Ht 67.5 in | Wt 244.1 lb

## 2011-10-21 DIAGNOSIS — Z01419 Encounter for gynecological examination (general) (routine) without abnormal findings: Secondary | ICD-10-CM | POA: Insufficient documentation

## 2011-10-21 DIAGNOSIS — D649 Anemia, unspecified: Secondary | ICD-10-CM

## 2011-10-21 DIAGNOSIS — Z0000 Encounter for general adult medical examination without abnormal findings: Secondary | ICD-10-CM

## 2011-10-21 DIAGNOSIS — E669 Obesity, unspecified: Secondary | ICD-10-CM

## 2011-10-21 DIAGNOSIS — R5381 Other malaise: Secondary | ICD-10-CM

## 2011-10-21 LAB — CBC
HCT: 38.2 % (ref 36.0–46.0)
Hemoglobin: 11.9 g/dL — ABNORMAL LOW (ref 12.0–15.0)
MCH: 26.2 pg (ref 26.0–34.0)
MCV: 84 fL (ref 78.0–100.0)
Platelets: 290 10*3/uL (ref 150–400)
RBC: 4.55 MIL/uL (ref 3.87–5.11)
WBC: 6.4 10*3/uL (ref 4.0–10.5)

## 2011-10-21 LAB — COMPREHENSIVE METABOLIC PANEL
CO2: 25 mEq/L (ref 19–32)
Calcium: 9.5 mg/dL (ref 8.4–10.5)
Chloride: 107 mEq/L (ref 96–112)
Creat: 0.71 mg/dL (ref 0.50–1.10)
Glucose, Bld: 81 mg/dL (ref 70–99)
Sodium: 140 mEq/L (ref 135–145)
Total Bilirubin: 0.2 mg/dL — ABNORMAL LOW (ref 0.3–1.2)
Total Protein: 6.6 g/dL (ref 6.0–8.3)

## 2011-10-21 LAB — TSH: TSH: 1.692 u[IU]/mL (ref 0.350–4.500)

## 2011-10-21 LAB — LIPID PANEL
HDL: 43 mg/dL (ref >39)
LDL Cholesterol: 133 mg/dL — ABNORMAL HIGH (ref 0–99)
Total CHOL/HDL Ratio: 5 Ratio
VLDL: 40 mg/dL (ref 0–40)

## 2011-10-21 LAB — POC HEMOCCULT BLD/STL (OFFICE/1-CARD/DIAGNOSTIC): Fecal Occult Blood, POC: NEGATIVE

## 2011-10-21 LAB — HEMOGLOBIN A1C: Hgb A1c MFr Bld: 5.5 % (ref <5.7)

## 2011-10-21 NOTE — Telephone Encounter (Signed)
None at this time.

## 2011-10-21 NOTE — Patient Instructions (Addendum)
F/u in 5  .5 month  Congrats on weight loss. HBA1C, cbc and anemia panel in 5.5 month, labs today are being cancelled   No calcium with D needed at your age, only  Multivitamin once daily   Stop paxil.  It is important that you exercise regularly at least 30 minutes 5 times a week. If you develop chest pain, have severe difficulty breathing, or feel very tired, stop exercising immediately and seek medical attention    All the best with school and your upcoming "Big day"

## 2011-10-21 NOTE — Progress Notes (Signed)
  Subjective:    Patient ID: Courtney Sims, female    DOB: Sep 26, 1985, 26 y.o.   MRN: [id number]  HPI The PT is here for annual exam  and re-evaluation of chronic medical conditions, medication management and review of any available recent lab and radiology data.  Preventive health is updated, specifically  Cancer screening and Immunization.   Questions or concerns regarding consultations or procedures which the PT has had in the interim are  addressed. The PT denies any adverse reactions to current medications since the last visit.  There are no new concerns.  There are no specific complaints . She has suceeded with weight loss, and no longer takes paxil regularly, only under extremely stressful situations      Review of Systems    See HPI Denies recent fever or chills. Denies sinus pressure, nasal congestion, ear pain or sore throat. Denies chest congestion, productive cough or wheezing. Denies chest pains, palpitations and leg swelling Denies abdominal pain, nausea, vomiting,diarrhea or constipation.   Denies dysuria, frequency, hesitancy or incontinence. Denies joint pain, swelling and limitation in mobility. Denies headaches, seizures, numbness, or tingling. Denies depression, anxiety or insomnia. Denies skin break down or rash.     Objective:   Physical Exam Pleasant well nourished female, alert and oriented x 3, in no cardio-pulmonary distress. Afebrile. HEENT No facial trauma or asymetry. Sinuses non tender.  EOMI, PERTL, fundoscopic exam is normal, no hemorhage or exudate.  External ears normal, tympanic membranes clear. Oropharynx moist, no exudate, good dentition. Neck: supple, no adenopathy,JVD or thyromegaly.No bruits.  Chest: Clear to ascultation bilaterally.No crackles or wheezes. Non tender to palpation  Breast: No asymetry,no masses. No nipple discharge or inversion. No axillary or supraclavicular adenopathy  Cardiovascular system; Heart sounds  normal,  S1 and  S2 ,no S3.  No murmur, or thrill. Apical beat not displaced Peripheral pulses normal.  Abdomen: Soft, non tender, no organomegaly or masses. No bruits. Bowel sounds normal. No guarding, tenderness or rebound.  Rectal:  No mass. Guaiac negative stool.(Pt has mild anemia)  GU: External genitalia normal. No lesions. Vaginal canal normal.No discharge. Uterus normal size, no adnexal masses, no cervical motion or adnexal tenderness.  Musculoskeletal exam: Full ROM of spine, hips , shoulders and knees. No deformity ,swelling or crepitus noted. No muscle wasting or atrophy.   Neurologic: Cranial nerves 2 to 12 intact. Power, tone ,sensation and reflexes normal throughout. No disturbance in gait. No tremor.  Skin: Intact, no ulceration, erythema , scaling or rash noted. Pigmentation normal throughout  Psych; Normal mood and affect. Judgement and concentration normal        Assessment & Plan:

## 2011-11-08 ENCOUNTER — Encounter: Payer: Self-pay | Admitting: Family Medicine

## 2011-11-08 ENCOUNTER — Ambulatory Visit (INDEPENDENT_AMBULATORY_CARE_PROVIDER_SITE_OTHER): Payer: 59 | Admitting: Family Medicine

## 2011-11-08 VITALS — BP 118/74 | HR 100 | Resp 18 | Ht 67.5 in | Wt 244.1 lb

## 2011-11-08 DIAGNOSIS — M549 Dorsalgia, unspecified: Secondary | ICD-10-CM

## 2011-11-08 DIAGNOSIS — E669 Obesity, unspecified: Secondary | ICD-10-CM

## 2011-11-08 MED ORDER — METHYLPREDNISOLONE ACETATE 80 MG/ML IJ SUSP
80.0000 mg | Freq: Once | INTRAMUSCULAR | Status: AC
  Administered 2011-11-08: 80 mg via INTRAMUSCULAR

## 2011-11-08 MED ORDER — PREDNISONE (PAK) 5 MG PO TABS: 5.0000 mg | ORAL_TABLET | ORAL | Status: DC

## 2011-11-08 MED ORDER — IBUPROFEN 800 MG PO TABS: 800.0000 mg | ORAL_TABLET | Freq: Three times a day (TID) | ORAL | Status: AC | PRN

## 2011-11-08 MED ORDER — KETOROLAC TROMETHAMINE 60 MG/2ML IJ SOLN
60.0000 mg | Freq: Once | INTRAMUSCULAR | Status: AC
  Administered 2011-11-08: 60 mg via INTRAMUSCULAR

## 2011-11-08 NOTE — Patient Instructions (Signed)
F/u as before.  You are being treated for acute back pain, with anti inflammatory injections in the office, as well as by mouth. This should provide good relief

## 2011-11-08 NOTE — Assessment & Plan Note (Signed)
Acute flare x 2 week, toradol 60mg  and depo medrol 80mg  im and pred and ibuprofen

## 2011-11-08 NOTE — Progress Notes (Signed)
  Subjective:    Patient ID: Courtney Sims, female    DOB: [birth year], 26 y.o.   MRN: [id number]  HPI 2 week h/o low back pain , worse after walking around or sitting for prolonged periods. Non radiaiting, no lower extremity symptoms. Was sitting cramped for several hours driving back from [place], has a chronic back pain history  Review of Systems See HPI Denies recent fever or chills. Denies sinus pressure, nasal congestion, ear pain or sore throat. Denies chest congestion, productive cough or wheezing.   Denies dysuria, frequency, hesitancy or incontinence. Denies, numbness, or tingling. Denies depression, anxiety or insomnia. Denies skin break down or rash.        Objective:   Physical Exam Patient alert and oriented and in no cardiopulmonary distress.  HEENT: No facial asymmetry, EOMI, no sinus tenderness,  oropharynx pink and moist.  Neck supple no adenopathy.  Chest: Clear to auscultation bilaterally.  CVS: S1, S2 no murmurs, no S3.  ABD: Soft non tender. Bowel sounds normal.  Ext: No edema  MS: decreased  ROM spine,adequate in  shoulders, hips and knees.  Skin: Intact, no ulcerations or rash noted.  Psych: Good eye contact, normal affect. Memory intact not anxious or depressed appearing.  CNS: CN 2-12 intact, power, tone and sensation normal throughout.        Assessment & Plan:

## 2011-11-09 NOTE — Assessment & Plan Note (Signed)
Unchanged. Patient re-educated about  the importance of commitment to a  minimum of 150 minutes of exercise per week. The importance of healthy food choices with portion control discussed. Encouraged to start a food diary, count calories and to consider  joining a support group. Sample diet sheets offered. Goals set by the patient for the next several months.    

## 2011-11-12 ENCOUNTER — Ambulatory Visit (INDEPENDENT_AMBULATORY_CARE_PROVIDER_SITE_OTHER): Payer: 59 | Admitting: Family Medicine

## 2011-11-12 ENCOUNTER — Encounter: Payer: Self-pay | Admitting: Family Medicine

## 2011-11-12 VITALS — BP 130/78 | HR 100 | Temp 99.1°F | Resp 16 | Ht 67.5 in | Wt 244.1 lb

## 2011-11-12 DIAGNOSIS — E669 Obesity, unspecified: Secondary | ICD-10-CM

## 2011-11-12 DIAGNOSIS — J209 Acute bronchitis, unspecified: Secondary | ICD-10-CM

## 2011-11-12 DIAGNOSIS — M549 Dorsalgia, unspecified: Secondary | ICD-10-CM

## 2011-11-12 MED ORDER — BENZONATATE 100 MG PO CAPS: 100.0000 mg | ORAL_CAPSULE | Freq: Four times a day (QID) | ORAL | Status: DC | PRN

## 2011-11-12 MED ORDER — CEFTRIAXONE SODIUM 500 MG IJ SOLR
500.0000 mg | Freq: Once | INTRAMUSCULAR | Status: AC
  Administered 2011-11-12: 500 mg via INTRAMUSCULAR

## 2011-11-12 MED ORDER — PENICILLIN V POTASSIUM 500 MG PO TABS: 500.0000 mg | ORAL_TABLET | Freq: Three times a day (TID) | ORAL | Status: AC

## 2011-11-12 NOTE — Assessment & Plan Note (Signed)
Unchanged. Patient re-educated about  the importance of commitment to a  minimum of 150 minutes of exercise per week. The importance of healthy food choices with portion control discussed. Encouraged to start a food diary, count calories and to consider  joining a support group. Sample diet sheets offered. Goals set by the patient for the next several months.    

## 2011-11-12 NOTE — Assessment & Plan Note (Signed)
Acute chest congestion and sputum, rocephin in the office and meds prescribed

## 2011-11-12 NOTE — Progress Notes (Signed)
  Subjective:    Patient ID: Courtney Sims, female    DOB: 02/29/1986, 26 y.o.   MRN: [id number]  HPI 4 day h/o cough productive of yellow sputum , als head pressure with ear popping and increased nasal drainage. No documented fever , but chills. Back pain has resolved   Review of Systems    See HPI Denies chest pains, palpitations and leg swelling Denies abdominal pain, nausea, vomiting,diarrhea or constipation.   Denies dysuria, frequency, hesitancy or incontinence. Denies joint pain, swelling and limitation in mobility. Denies headaches, seizures, numbness, or tingling. Denies depression, anxiety or insomnia. Denies skin break down or rash.     Objective:   Physical Exam Patient alert and oriented and in no cardiopulmonary distress.  HEENT: No facial asymmetry, EOMI, no  sinus tenderness,  oropharynx pink and moist.  Neck supple no adenopathy.TM eryhtmeatous  Chest: decreased air entry, scattered crackles, no wheeze CVS: S1, S2 no murmurs, no S3.  ABD: Soft non tender. Bowel sounds normal.  Ext: No edema  MS: Adequate ROM spine, shoulders, hips and knees.  Skin: Intact, no ulcerations or rash noted.  Psych: Good eye contact, normal affect. Memory intact not anxious or depressed appearing.  CNS: CN 2-12 intact, power, tone and sensation normal throughout.        Assessment & Plan:

## 2011-11-12 NOTE — Patient Instructions (Signed)
F/u as before.  You are being treated for acute bronchitis.  Rocephin 500mg  IM in the office and medication is sent to your pharmacy

## 2011-11-12 NOTE — Assessment & Plan Note (Signed)
Marked improvement. 

## 2011-12-05 ENCOUNTER — Telehealth: Payer: Self-pay | Admitting: Family Medicine

## 2011-12-05 NOTE — Telephone Encounter (Signed)
Unavailable to talk. Has the same symptoms as mom and sister. Can she get anything called in for it?

## 2011-12-06 ENCOUNTER — Other Ambulatory Visit: Payer: Self-pay | Admitting: Family Medicine

## 2011-12-06 MED ORDER — PENICILLIN V POTASSIUM 500 MG PO TABS: 500.0000 mg | ORAL_TABLET | Freq: Three times a day (TID) | ORAL | Status: AC

## 2011-12-06 NOTE — Telephone Encounter (Signed)
Mother aware that medication has been sent in , penicillin

## 2011-12-12 ENCOUNTER — Telehealth: Payer: Self-pay | Admitting: Family Medicine

## 2011-12-12 NOTE — Telephone Encounter (Signed)
Needs ov

## 2011-12-13 ENCOUNTER — Encounter: Payer: Self-pay | Admitting: Family Medicine

## 2011-12-13 ENCOUNTER — Ambulatory Visit (HOSPITAL_COMMUNITY)
Admission: RE | Admit: 2011-12-13 | Discharge: 2011-12-13 | Disposition: A | Discharge status: Home / Self Care | Payer: 59 | Source: Ambulatory Visit | Attending: Family Medicine | Admitting: Family Medicine

## 2011-12-13 ENCOUNTER — Ambulatory Visit (INDEPENDENT_AMBULATORY_CARE_PROVIDER_SITE_OTHER): Payer: 59 | Admitting: Family Medicine

## 2011-12-13 VITALS — BP 112/84 | HR 108 | Temp 98.9°F | Resp 16 | Ht 67.5 in | Wt 241.0 lb

## 2011-12-13 DIAGNOSIS — H6691 Otitis media, unspecified, right ear: Secondary | ICD-10-CM

## 2011-12-13 DIAGNOSIS — M549 Dorsalgia, unspecified: Secondary | ICD-10-CM

## 2011-12-13 DIAGNOSIS — R0989 Other specified symptoms and signs involving the circulatory and respiratory systems: Secondary | ICD-10-CM | POA: Insufficient documentation

## 2011-12-13 DIAGNOSIS — J029 Acute pharyngitis, unspecified: Secondary | ICD-10-CM

## 2011-12-13 DIAGNOSIS — H6690 Otitis media, unspecified, unspecified ear: Secondary | ICD-10-CM

## 2011-12-13 DIAGNOSIS — R05 Cough: Secondary | ICD-10-CM | POA: Insufficient documentation

## 2011-12-13 DIAGNOSIS — R059 Cough, unspecified: Secondary | ICD-10-CM | POA: Insufficient documentation

## 2011-12-13 DIAGNOSIS — J209 Acute bronchitis, unspecified: Secondary | ICD-10-CM | POA: Insufficient documentation

## 2011-12-13 DIAGNOSIS — J019 Acute sinusitis, unspecified: Secondary | ICD-10-CM

## 2011-12-13 DIAGNOSIS — H6692 Otitis media, unspecified, left ear: Secondary | ICD-10-CM

## 2011-12-13 LAB — BASIC METABOLIC PANEL
Calcium: 10.2 mg/dL (ref 8.4–10.5)
Creat: 0.71 mg/dL (ref 0.50–1.10)

## 2011-12-13 LAB — CBC WITH DIFFERENTIAL/PLATELET
Basophils Absolute: 0 10*3/uL (ref 0.0–0.1)
Eosinophils Absolute: 0.1 10*3/uL (ref 0.0–0.7)
Eosinophils Relative: 1 % (ref 0–5)
MCH: 29.3 pg (ref 26.0–34.0)
MCHC: 32.3 g/dL (ref 30.0–36.0)
MCV: 90.6 fL (ref 78.0–100.0)
Platelets: 282 10*3/uL (ref 150–400)
RDW: 15.2 % (ref 11.5–15.5)

## 2011-12-13 MED ORDER — SULFAMETHOXAZOLE-TRIMETHOPRIM 800-160 MG PO TABS: 1.0000 | ORAL_TABLET | Freq: Two times a day (BID) | ORAL | Status: AC

## 2011-12-13 MED ORDER — CHLORPHENIRAMINE-HYDROCODONE 8-10 MG/5ML PO LQCR: 5.0000 mL | Freq: Two times a day (BID) | ORAL | Status: DC | PRN

## 2011-12-13 MED ORDER — PREDNISONE (PAK) 5 MG PO TABS: 5.0000 mg | ORAL_TABLET | ORAL | Status: DC

## 2011-12-13 NOTE — Patient Instructions (Signed)
F/U as before.  You are being treated for right ear infection, sinusitis and bronchitis.  Rocephin 500mg  in the office and antibiotics and cough suppressant are sent to the pharmacy  Use mucinex.one twice daily for 5 to 7 days  You need chest xray and stat labs today, we will call with results.Throat swab in office today  Fluids and rest are important

## 2011-12-13 NOTE — Telephone Encounter (Signed)
Left message with Courtney Sims for Soyla to call

## 2011-12-13 NOTE — Progress Notes (Signed)
  Subjective:    Patient ID: Courtney Sims, female    DOB: 1986-01-14, 26 y.o.   MRN: [id number]  HPI 10 day h/o chest and head  Congestion, sore throat and right  ear pain, was on penicillin 1 month ago, family has been sick. Reports green nasal discharge and green sputum with chills. States she had stared to improve , but believes she has had recurrence in symptoms due to exposure to rst of family who has been ill   Review of Systems See HPI  Denies chest pains, palpitations and leg swelling Denies abdominal pain, nausea, vomiting,diarrhea or constipation.   Denies dysuria, frequency, hesitancy or incontinence. Denies joint pain, swelling and limitation in mobility. Denies headaches, seizures, numbness, or tingling. Denies depression, anxiety or insomnia. Denies skin break down or rash.       Objective:   Physical Exam Patient alert and oriented and in no cardiopulmonary distress.Ill appearing  HEENT: No facial asymmetry, EOMI, maxillary sinus tenderness,  oropharynx erythematous , no exudate.  Neck supple anterior cervical adenopathy.Right  TM mildly erythematous and dull  Chest: decreased air entry with bibasilar crackles, no wheezes CVS: S1, S2 no murmurs, no S3.  ABD: Soft non tender. Bowel sounds normal.  Ext: No edema  MS: Adequate ROM spine, shoulders, hips and knees.  Skin: Intact, no ulcerations or rash noted.  Psych: Good eye contact, normal affect. Memory intact not anxious or depressed appearing.  CNS: CN 2-12 intact, power, tone and sensation normal throughout.        Assessment & Plan:

## 2011-12-14 DIAGNOSIS — J019 Acute sinusitis, unspecified: Secondary | ICD-10-CM | POA: Insufficient documentation

## 2011-12-14 DIAGNOSIS — H6691 Otitis media, unspecified, right ear: Secondary | ICD-10-CM | POA: Insufficient documentation

## 2011-12-14 NOTE — Assessment & Plan Note (Addendum)
Recurrent illness in less than 6 weeks, will obtain baseline data, and prescribe antibiotic, and anti inflammatory

## 2011-12-14 NOTE — Assessment & Plan Note (Signed)
Antibiotic prescribed 

## 2011-12-14 NOTE — Assessment & Plan Note (Signed)
Ten day course of antibiotics prescribed

## 2011-12-16 MED ORDER — CEFTRIAXONE SODIUM 500 MG IJ SOLR
500.0000 mg | Freq: Once | INTRAMUSCULAR | Status: AC
  Administered 2011-12-16: 500 mg via INTRAMUSCULAR

## 2011-12-16 MED ORDER — METHYLPREDNISOLONE ACETATE 80 MG/ML IJ SUSP
80.0000 mg | Freq: Once | INTRAMUSCULAR | Status: AC
  Administered 2011-12-16: 80 mg via INTRAMUSCULAR

## 2011-12-16 NOTE — Progress Notes (Signed)
Addended by: Abner Greenspan on: 12/16/2011 09:25 AM   Modules accepted: Orders

## 2012-03-09 ENCOUNTER — Ambulatory Visit (INDEPENDENT_AMBULATORY_CARE_PROVIDER_SITE_OTHER): Payer: 59 | Admitting: Family Medicine

## 2012-03-09 ENCOUNTER — Encounter: Payer: Self-pay | Admitting: Family Medicine

## 2012-03-09 VITALS — BP 126/72 | HR 89 | Resp 18 | Ht 67.5 in | Wt 239.0 lb

## 2012-03-09 DIAGNOSIS — E669 Obesity, unspecified: Secondary | ICD-10-CM

## 2012-03-09 DIAGNOSIS — R1011 Right upper quadrant pain: Secondary | ICD-10-CM

## 2012-03-09 NOTE — Progress Notes (Signed)
  Subjective:    Patient ID: Courtney Sims, female    DOB: 06/09/1986, 26 y.o.   MRN: [id number]  HPI 3 day h/o intermittent epigastric and rUQ pain up to an 8 , food aggravates, excessive belching and bloating, denies fever, chills, dysuria or frequency. Has no kidney stone hiostory   Review of Systems See HPI Denies recent fever or chills. Denies sinus pressure, nasal congestion, ear pain or sore throat. Denies chest congestion, productive cough or wheezing. Denies chest pains, palpitations and leg swelling  Denies dysuria, frequency, hesitancy or incontinence.  Denies depression, anxiety or insomnia. Denies skin break down or rash.        Objective:   Physical Exam Patient alert and oriented and in no cardiopulmonary distress.  HEENT: No facial asymmetry, EOMI, no sinus tenderness,  oropharynx pink and moist.  Neck supple no adenopathy.  Chest: Clear to auscultation bilaterally.  CVS: S1, S2 no murmurs, no S3.  ABD: Soft mild RUQ tenderness, no guarding or rebound. No palpable abnormality, no masses Normal bowel sounds Ext: No edema  MS: Adequate ROM spine, shoulders, hips and knees.  Skin: Intact, no ulcerations or rash noted.  Psych: Good eye contact, normal affect. Memory intact not anxious or depressed appearing.  CNS: CN 2-12 intact, power, tone and sensation normal throughout.        Assessment & Plan:

## 2012-03-09 NOTE — Patient Instructions (Addendum)
F/u as before.  Your symptoms sound as though you may have gallstones or a non functional gall bladder.  You are referred for further evaluation.  If these tests are negative , you will be referred to a gastroenterologist

## 2012-03-10 ENCOUNTER — Ambulatory Visit (HOSPITAL_COMMUNITY)
Admission: RE | Admit: 2012-03-10 | Discharge: 2012-03-10 | Disposition: A | Discharge status: Home / Self Care | Payer: 59 | Source: Ambulatory Visit | Attending: Family Medicine | Admitting: Family Medicine

## 2012-03-10 DIAGNOSIS — R1011 Right upper quadrant pain: Secondary | ICD-10-CM | POA: Insufficient documentation

## 2012-03-12 ENCOUNTER — Telehealth: Payer: Self-pay | Admitting: Family Medicine

## 2012-03-15 ENCOUNTER — Other Ambulatory Visit: Payer: Self-pay | Admitting: Family Medicine

## 2012-03-16 ENCOUNTER — Telehealth: Payer: Self-pay | Admitting: Family Medicine

## 2012-03-16 NOTE — Assessment & Plan Note (Signed)
Acute symptom onset and severity suggest gall bladder pathology, will order tests to further eval same

## 2012-03-16 NOTE — Assessment & Plan Note (Signed)
Improved. Pt applauded on succesful weight loss through lifestyle change, and encouraged to continue same. Weight loss goal set for the next several months.  

## 2012-03-17 ENCOUNTER — Other Ambulatory Visit (HOSPITAL_COMMUNITY): Payer: 59

## 2012-03-24 ENCOUNTER — Other Ambulatory Visit: Payer: Self-pay | Admitting: Internal Medicine

## 2012-03-24 ENCOUNTER — Encounter (HOSPITAL_COMMUNITY): Payer: 59

## 2012-03-26 ENCOUNTER — Encounter (HOSPITAL_COMMUNITY): Payer: Self-pay

## 2012-03-26 ENCOUNTER — Encounter (HOSPITAL_COMMUNITY)
Admission: RE | Admit: 2012-03-26 | Discharge: 2012-03-26 | Disposition: A | Discharge status: Home / Self Care | Payer: 59 | Source: Ambulatory Visit | Attending: Family Medicine | Admitting: Family Medicine

## 2012-03-26 DIAGNOSIS — R1013 Epigastric pain: Secondary | ICD-10-CM | POA: Insufficient documentation

## 2012-03-26 DIAGNOSIS — K3189 Other diseases of stomach and duodenum: Secondary | ICD-10-CM | POA: Insufficient documentation

## 2012-03-26 DIAGNOSIS — R1011 Right upper quadrant pain: Secondary | ICD-10-CM | POA: Insufficient documentation

## 2012-03-26 MED ORDER — TECHNETIUM TC 99M MEBROFENIN IV KIT
5.0000 | PACK | Freq: Once | INTRAVENOUS | Status: AC | PRN
  Administered 2012-03-26: 5.2 via INTRAVENOUS

## 2012-03-27 NOTE — Telephone Encounter (Signed)
Patient is aware 

## 2012-03-27 NOTE — Telephone Encounter (Signed)
Patient aware.

## 2012-04-01 ENCOUNTER — Ambulatory Visit: Payer: 59 | Admitting: Family Medicine

## 2012-04-03 ENCOUNTER — Telehealth: Payer: Self-pay | Admitting: Family Medicine

## 2012-04-03 NOTE — Telephone Encounter (Signed)
Patient aware.

## 2012-05-06 ENCOUNTER — Encounter: Payer: Self-pay | Admitting: Family Medicine

## 2012-05-06 ENCOUNTER — Ambulatory Visit (INDEPENDENT_AMBULATORY_CARE_PROVIDER_SITE_OTHER): Payer: 59 | Admitting: Family Medicine

## 2012-05-06 ENCOUNTER — Other Ambulatory Visit: Payer: Self-pay

## 2012-05-06 VITALS — BP 118/80 | HR 106 | Temp 98.9°F | Resp 18 | Ht 67.5 in | Wt 234.1 lb

## 2012-05-06 DIAGNOSIS — J111 Influenza due to unidentified influenza virus with other respiratory manifestations: Secondary | ICD-10-CM

## 2012-05-06 DIAGNOSIS — E669 Obesity, unspecified: Secondary | ICD-10-CM

## 2012-05-06 DIAGNOSIS — J029 Acute pharyngitis, unspecified: Secondary | ICD-10-CM

## 2012-05-06 DIAGNOSIS — J209 Acute bronchitis, unspecified: Secondary | ICD-10-CM

## 2012-05-06 DIAGNOSIS — J019 Acute sinusitis, unspecified: Secondary | ICD-10-CM

## 2012-05-06 LAB — CBC WITH DIFFERENTIAL/PLATELET
Basophils Relative: 0 % (ref 0–1)
Eosinophils Absolute: 0.2 10*3/uL (ref 0.0–0.7)
Lymphs Abs: 1.1 10*3/uL (ref 0.7–4.0)
MCH: 27.4 pg (ref 26.0–34.0)
Neutrophils Relative %: 70 % (ref 43–77)
Platelets: 229 10*3/uL (ref 150–400)
RBC: 4.74 MIL/uL (ref 3.87–5.11)
WBC: 6.6 10*3/uL (ref 4.0–10.5)

## 2012-05-06 MED ORDER — OSELTAMIVIR PHOSPHATE 75 MG PO CAPS: 75.0000 mg | ORAL_CAPSULE | Freq: Two times a day (BID) | ORAL | Status: DC

## 2012-05-06 MED ORDER — BENZONATATE 100 MG PO CAPS: 100.0000 mg | ORAL_CAPSULE | Freq: Four times a day (QID) | ORAL | Status: DC | PRN

## 2012-05-06 MED ORDER — CEFTRIAXONE SODIUM 500 MG IJ SOLR
500.0000 mg | Freq: Once | INTRAMUSCULAR | Status: AC
  Administered 2012-05-06: 500 mg via INTRAMUSCULAR

## 2012-05-06 MED ORDER — CEFTRIAXONE SODIUM 500 MG IJ SOLR: 500.0000 mg | Freq: Every day | INTRAMUSCULAR | Status: DC

## 2012-05-06 MED ORDER — AMOXICILLIN-POT CLAVULANATE 875-125 MG PO TABS: 1.0000 | ORAL_TABLET | Freq: Two times a day (BID) | ORAL | Status: AC

## 2012-05-06 MED ORDER — AMOXICILLIN-POT CLAVULANATE 875-125 MG PO TABS: 1.0000 | ORAL_TABLET | Freq: Two times a day (BID) | ORAL | Status: DC

## 2012-05-06 NOTE — Patient Instructions (Addendum)
cPE March 5 or after  Congrats on weight loss   You are being treated for influenza and acute sinusitis and bronchitis. Rapid strep is negative   Rocephin 500mg  im in office, and augmentin and tessalon perles to pharmacy. Fluids and  rest. School excuse to return 05/08/2012   CBC and diff today stat  Please use condoms also for the next month, as the pill will be less effective now you are on antibiotics

## 2012-05-06 NOTE — Progress Notes (Signed)
  Subjective:    Patient ID: Courtney Sims, female    DOB: Apr 25, 1986, 26 y.o.   MRN: [id number]  HPI 3 day h/o generalized body aches, fever and chills, sore throat, green nasal drainage and cough productive of yellow sputum. Feels weak, poor apetitie. Denies ear pain , has tender neck glands   Review of Systems See HPI Denies  palpitations and leg swelling. Chest pain from excessive cough  Denies abdominal pain, nausea, vomiting,diarrhea or constipation.   Denies dysuria, frequency, hesitancy or incontinence. Denies joint pain, swelling and limitation in mobility. Denies headaches, seizures, numbness, or tingling. Denies depression, anxiety or insomnia. Denies skin break down or rash.        Objective:   Physical Exam Patient alert and oriented and in no cardiopulmonary distress.Ill appearing   HEENT: No facial asymmetry, EOMI, frontal and maxillary sinus tenderness,  oropharynx erythematous and moist.  Neck supple anterior cervical  adenopathy.  Chest: decreased air entry, bilateral crackles , no wheezes.  CVS: S1, S2 no murmurs, no S3.  ABD: Soft non tender. Bowel sounds normal.  Ext: No edema  MS: Adequate ROM spine, shoulders, hips and knees.  Skin: Intact, no ulcerations or rash noted.  Psych: Good eye contact, normal affect. Memory intact not anxious or depressed appearing.  CNS: CN 2-12 intact, power, tone and sensation normal throughout.        Assessment & Plan:

## 2012-05-10 DIAGNOSIS — J029 Acute pharyngitis, unspecified: Secondary | ICD-10-CM | POA: Insufficient documentation

## 2012-05-10 NOTE — Assessment & Plan Note (Signed)
Antibiotic prescribed 

## 2012-05-10 NOTE — Assessment & Plan Note (Signed)
Improved. Pt applauded on succesful weight loss through lifestyle change, and encouraged to continue same. Weight loss goal set for the next several months.  

## 2012-05-10 NOTE — Assessment & Plan Note (Signed)
Antiviral and rest

## 2012-05-10 NOTE — Assessment & Plan Note (Signed)
Rapid strep negative, salt water gargles

## 2012-05-10 NOTE — Assessment & Plan Note (Signed)
Antibiotic and decongestant prescribed 

## 2012-05-12 LAB — POCT RAPID STREP A (OFFICE): Rapid Strep A Screen: NEGATIVE

## 2012-05-12 NOTE — Addendum Note (Signed)
Addended by: Kandis Fantasia B on: 05/12/2012 10:32 AM   Modules accepted: Orders

## 2012-06-22 ENCOUNTER — Telehealth: Payer: Self-pay | Admitting: Family Medicine

## 2012-06-22 ENCOUNTER — Other Ambulatory Visit: Payer: Self-pay | Admitting: Family Medicine

## 2012-06-22 MED ORDER — PENICILLIN V POTASSIUM 500 MG PO TABS: 500.0000 mg | ORAL_TABLET | Freq: Three times a day (TID) | ORAL | Status: DC

## 2012-06-22 NOTE — Telephone Encounter (Signed)
Spoke with pt has 4 days of symptoms   Advised her 10 day pen being sent [facility] in [place], pls fax and verify with her where sent

## 2012-06-22 NOTE — Telephone Encounter (Signed)
Pt aware. Sent to [facility] in [place]

## 2012-08-31 ENCOUNTER — Telehealth: Payer: Self-pay | Admitting: Family Medicine

## 2012-08-31 NOTE — Telephone Encounter (Signed)
Advised to check with her highschool or her pediatric Dr

## 2012-10-28 ENCOUNTER — Encounter: Payer: 59 | Admitting: Family Medicine

## 2012-11-19 ENCOUNTER — Encounter: Payer: 59 | Admitting: Family Medicine

## 2012-11-30 ENCOUNTER — Other Ambulatory Visit: Payer: Self-pay

## 2012-11-30 ENCOUNTER — Telehealth: Payer: Self-pay | Admitting: Family Medicine

## 2012-11-30 MED ORDER — NORETHIN-ETH ESTRAD TRIPHASIC 0.5/0.75/1-35 MG-MCG PO TABS: ORAL_TABLET | ORAL | Status: DC

## 2012-11-30 NOTE — Telephone Encounter (Signed)
Med refilled.

## 2012-12-15 ENCOUNTER — Telehealth: Payer: Self-pay | Admitting: Family Medicine

## 2012-12-15 NOTE — Telephone Encounter (Signed)
Called and left msg for patient notifying her that it is fine to have her blood drawn at any [facility].

## 2012-12-17 ENCOUNTER — Telehealth: Payer: Self-pay

## 2012-12-17 DIAGNOSIS — R5381 Other malaise: Secondary | ICD-10-CM

## 2012-12-17 DIAGNOSIS — Z139 Encounter for screening, unspecified: Secondary | ICD-10-CM

## 2012-12-17 DIAGNOSIS — E669 Obesity, unspecified: Secondary | ICD-10-CM

## 2012-12-17 LAB — CBC WITH DIFFERENTIAL/PLATELET
Basophils Absolute: 0 10*3/uL (ref 0.0–0.1)
Basophils Relative: 0 % (ref 0–1)
Hemoglobin: 12.9 g/dL (ref 12.0–15.0)
MCHC: 32.9 g/dL (ref 30.0–36.0)
Neutro Abs: 3.5 10*3/uL (ref 1.7–7.7)
Neutrophils Relative %: 37 % — ABNORMAL LOW (ref 43–77)
RDW: 15.3 % (ref 11.5–15.5)

## 2012-12-17 NOTE — Telephone Encounter (Signed)
Labs reordered and faxed to requested [facility].

## 2012-12-18 ENCOUNTER — Ambulatory Visit (INDEPENDENT_AMBULATORY_CARE_PROVIDER_SITE_OTHER): Payer: Managed Care, Other (non HMO) | Admitting: Family Medicine

## 2012-12-18 ENCOUNTER — Encounter: Payer: Self-pay | Admitting: Family Medicine

## 2012-12-18 ENCOUNTER — Other Ambulatory Visit (HOSPITAL_COMMUNITY)
Admission: RE | Admit: 2012-12-18 | Discharge: 2012-12-18 | Disposition: A | Discharge status: Home / Self Care | Payer: Managed Care, Other (non HMO) | Source: Ambulatory Visit | Attending: Family Medicine | Admitting: Family Medicine

## 2012-12-18 VITALS — BP 120/80 | HR 80 | Resp 18 | Ht 67.5 in | Wt 242.1 lb

## 2012-12-18 DIAGNOSIS — Z09 Encounter for follow-up examination after completed treatment for conditions other than malignant neoplasm: Secondary | ICD-10-CM | POA: Insufficient documentation

## 2012-12-18 DIAGNOSIS — Z0000 Encounter for general adult medical examination without abnormal findings: Secondary | ICD-10-CM

## 2012-12-18 DIAGNOSIS — E8881 Metabolic syndrome: Secondary | ICD-10-CM | POA: Insufficient documentation

## 2012-12-18 DIAGNOSIS — Z01419 Encounter for gynecological examination (general) (routine) without abnormal findings: Secondary | ICD-10-CM | POA: Insufficient documentation

## 2012-12-18 DIAGNOSIS — Z124 Encounter for screening for malignant neoplasm of cervix: Secondary | ICD-10-CM

## 2012-12-18 DIAGNOSIS — E785 Hyperlipidemia, unspecified: Secondary | ICD-10-CM | POA: Insufficient documentation

## 2012-12-18 DIAGNOSIS — J309 Allergic rhinitis, unspecified: Secondary | ICD-10-CM

## 2012-12-18 LAB — HEMOGLOBIN A1C
Hgb A1c MFr Bld: 5.5 % (ref <5.7)
Mean Plasma Glucose: 111 mg/dL (ref <117)

## 2012-12-18 MED ORDER — FLUTICASONE PROPIONATE 50 MCG/ACT NA SUSP: 2.0000 | Freq: Every day | NASAL | Status: DC

## 2012-12-18 MED ORDER — LORATADINE 10 MG PO TABS: 10.0000 mg | ORAL_TABLET | Freq: Every day | ORAL | Status: DC

## 2012-12-18 NOTE — Patient Instructions (Addendum)
F/u in 6 month, call if you need me before, please  !500 calorie diet sheet will be provided.  Fasting lipid, and HBA1C in 6 month  Claritin and flonase are sent to your pharmacy.  Three months before time you want to start conceiving is appropriate to stop the birth control pills

## 2012-12-18 NOTE — Progress Notes (Signed)
  Subjective:    Patient ID: Courtney Sims, female    DOB: March 31, 1986, 27 y.o.   MRN: [id number]  HPI Pt in for annual exam Recently treated at urgent care for uncontrolled allergy symptoms, and is doing better. Has questions about fertility, had regular cycles, contemplating trying for conception in the Fall, I advise her to plan to stop the pill approx 3 months before this Intends to be more diligent with weight loss effort also   Review of Systems    See HPI Denies recent fever or chills.  Denies chest congestion, productive cough or wheezing. Denies chest pains, palpitations and leg swelling Denies abdominal pain, nausea, vomiting,diarrhea or constipation.   Denies dysuria, frequency, hesitancy or incontinence. Denies joint pain, swelling and limitation in mobility. Denies headaches, seizures, numbness, or tingling. Denies depression, anxiety or insomnia. Denies skin break down or rash.     Objective:   Physical Exam  Pleasant obese female, alert and oriented x 3, in no cardio-pulmonary distress. Afebrile. HEENT No facial trauma or asymetry. Sinuses non tender.  EOMI, PERTL, fundoscopic exam is normal, no hemorhage or exudate.  External ears normal, tympanic membranes clear. Oropharynx moist, no exudate, good dentition. Neck: supple, no adenopathy,JVD or thyromegaly.No bruits.  Chest: Clear to ascultation bilaterally.No crackles or wheezes. Non tender to palpation  Breast: No asymetry,no masses. No nipple discharge or inversion. No axillary or supraclavicular adenopathy  Cardiovascular system; Heart sounds normal,  S1 and  S2 ,no S3.  No murmur, or thrill. Apical beat not displaced Peripheral pulses normal.  Abdomen: Soft, non tender, no organomegaly or masses. No bruits. Bowel sounds normal. No guarding, tenderness or rebound.    GU: External genitalia normal. No lesions. Vaginal canal normal.No discharge. Uterus normal size, no adnexal masses,  no cervical motion or adnexal tenderness.  Musculoskeletal exam: Full ROM of spine, hips , shoulders and knees. No deformity ,swelling or crepitus noted. No muscle wasting or atrophy.   Neurologic: Cranial nerves 2 to 12 intact. Power, tone ,sensation and reflexes normal throughout. No disturbance in gait. No tremor.  Skin: Intact, no ulceration, erythema , scaling or rash noted. Pigmentation normal throughout  Psych; Normal mood and affect. Judgement and concentration normal       Assessment & Plan:

## 2012-12-20 NOTE — Assessment & Plan Note (Signed)
Physical exam as documented in record. Major health challenge is weight and inconsistent physical activity, pt intends to work on both

## 2013-03-07 ENCOUNTER — Emergency Department (HOSPITAL_COMMUNITY)
Admission: EM | Admit: 2013-03-07 | Discharge: 2013-03-07 | Disposition: A | Discharge status: Home / Self Care | Payer: Managed Care, Other (non HMO) | Attending: Emergency Medicine | Admitting: Emergency Medicine

## 2013-03-07 ENCOUNTER — Encounter (HOSPITAL_COMMUNITY): Payer: Self-pay | Admitting: *Deleted

## 2013-03-07 DIAGNOSIS — S81009A Unspecified open wound, unspecified knee, initial encounter: Secondary | ICD-10-CM | POA: Insufficient documentation

## 2013-03-07 DIAGNOSIS — W268XXA Contact with other sharp object(s), not elsewhere classified, initial encounter: Secondary | ICD-10-CM | POA: Insufficient documentation

## 2013-03-07 DIAGNOSIS — Z79899 Other long term (current) drug therapy: Secondary | ICD-10-CM | POA: Insufficient documentation

## 2013-03-07 DIAGNOSIS — S91009A Unspecified open wound, unspecified ankle, initial encounter: Secondary | ICD-10-CM | POA: Insufficient documentation

## 2013-03-07 DIAGNOSIS — Z23 Encounter for immunization: Secondary | ICD-10-CM | POA: Insufficient documentation

## 2013-03-07 DIAGNOSIS — E669 Obesity, unspecified: Secondary | ICD-10-CM | POA: Insufficient documentation

## 2013-03-07 DIAGNOSIS — S81811A Laceration without foreign body, right lower leg, initial encounter: Secondary | ICD-10-CM

## 2013-03-07 DIAGNOSIS — Y929 Unspecified place or not applicable: Secondary | ICD-10-CM | POA: Insufficient documentation

## 2013-03-07 DIAGNOSIS — Y9389 Activity, other specified: Secondary | ICD-10-CM | POA: Insufficient documentation

## 2013-03-07 MED ORDER — BACITRACIN ZINC 500 UNIT/GM EX OINT
TOPICAL_OINTMENT | CUTANEOUS | Status: AC
  Administered 2013-03-07: 1
  Filled 2013-03-07: qty 0.9

## 2013-03-07 MED ORDER — TETANUS-DIPHTH-ACELL PERTUSSIS 5-2.5-18.5 LF-MCG/0.5 IM SUSP
0.5000 mL | Freq: Once | INTRAMUSCULAR | Status: AC
  Administered 2013-03-07: 0.5 mL via INTRAMUSCULAR
  Filled 2013-03-07: qty 0.5

## 2013-03-07 MED ORDER — LIDOCAINE HCL (PF) 1 % IJ SOLN
INTRAMUSCULAR | Status: AC
  Administered 2013-03-07: 21:00:00
  Filled 2013-03-07: qty 5

## 2013-03-07 NOTE — ED Provider Notes (Signed)
History    CSN: [id number] Arrival date & time 03/07/13  1947  First MD Initiated Contact with Patient 03/07/13 1952     Chief Complaint  Patient presents with  . Laceration   (Consider location/radiation/quality/duration/timing/severity/associated sxs/prior Treatment) Patient is a 27 y.o. female presenting with skin laceration. The history is provided by the patient.  Laceration Location:  Leg Leg laceration location:  R lower leg Depth:  Through dermis Quality: jagged   Time since incident:  30 minutes Laceration mechanism:  Metal edge Pain details:    Quality:  Dull   Severity:  Mild   Progression:  Unchanged Foreign body present:  No foreign bodies Relieved by:  Nothing Worsened by:  Nothing tried Ineffective treatments:  None tried Tetanus status:  Out of date  CARLOS QUACKENBUSH is a 27 y.o. female who presents to the ED after she was sitting in a metal chair that broke and a pice of metal scraped her right lower leg.   Past Medical History  Diagnosis Date  . Obesity    Past Surgical History  Procedure Laterality Date  . Denies     Family History  Problem Relation Age of Onset  . Hypertension Mother     obese, perennial alllergies  . Hypertension Father   . Diabetes Father   . Hyperlipidemia Father     obese   History  Substance Use Topics  . Smoking status: Never Smoker   . Smokeless tobacco: Not on file  . Alcohol Use: No   OB History   Grav Para Term Preterm Abortions TAB SAB Ect Mult Living                 Review of Systems  Constitutional: Negative for fever and chills.  Respiratory: Negative for shortness of breath.   Gastrointestinal: Negative for nausea and vomiting.  Musculoskeletal:       Laceration right leg  Skin: Positive for wound.  Neurological: Negative for headaches.  Psychiatric/Behavioral: The patient is not nervous/anxious.     Allergies  Review of patient's allergies indicates no known allergies.  Home Medications     Current Outpatient Rx  Name  Route  Sig  Dispense  Refill  . ibuprofen (ADVIL,MOTRIN) 200 MG tablet   Oral   Take 800 mg by mouth every 6 (six) hours as needed for pain.         Marland Kitchen loratadine (CLARITIN) 10 MG tablet   Oral   Take 10 mg by mouth daily as needed for allergies.         . Multiple Vitamin (MULTIVITAMIN WITH MINERALS) TABS   Oral   Take 1 tablet by mouth daily.         . norethindrone-ethinyl estradiol (TRIPHASIL,CYCLAFEM,ALYACEN) 0.5/0.75/1-35 MG-MCG tablet   Oral   Take 1 tablet by mouth daily.          BP 136/93  Pulse 97  Temp(Src) 98.3 F (36.8 C) (Oral)  Resp 24  Ht 5\' 7"  (1.702 m)  Wt 248 lb (112.492 kg)  BMI 38.83 kg/m2  SpO2 100%  LMP 03/07/2013 Physical Exam  Nursing note and vitals reviewed. Constitutional: She is oriented to person, place, and time. She appears well-developed and well-nourished.  HENT:  Head: Normocephalic.  Eyes: EOM are normal.  Neck: Neck supple.  Cardiovascular: Normal rate.   Pulmonary/Chest: Effort normal.  Abdominal: Soft. There is no tenderness.  Musculoskeletal: Normal range of motion.       Right lower leg: She  exhibits tenderness and laceration.       Legs: Laceration lower leg  Neurological: She is alert and oriented to person, place, and time. No cranial nerve deficit.  Skin: Skin is warm and dry.  Psychiatric: She has a normal mood and affect. Her behavior is normal.    ED Course  Procedures LACERATION REPAIR Performed by: NEESE,HOPE Authorized by: NEESE,HOPE Consent: Verbal consent obtained. Risks and benefits: risks, benefits and alternatives were discussed Consent given by: patient Patient identity confirmed: provided demographic data Prepped and Draped in normal sterile fashion Wound explored  Laceration Location: lateral aspect right calf  Laceration Length: 2 cm  No Foreign Bodies seen or palpated  Anesthesia: local infiltration  Local anesthetic: lidocaine 1% without  epinephrine  Anesthetic total: 2 ml  Irrigation method: syringe Amount of cleaning: standard  Skin closure: 4-0 prolene  Number of sutures: 5  Technique: interrupted  Patient tolerance: Patient tolerated the procedure well with no immediate complications.  MDM  27 y.o. female with laceration to the right lower leg. Tetanus updated, bacitracin ointment and dressing.  Discussed with the patient plan of careand all questioned fully answered. She will return if any problems arise.    Medication List    ASK your doctor about these medications       ibuprofen 200 MG tablet  Commonly known as:  ADVIL,MOTRIN  Take 800 mg by mouth every 6 (six) hours as needed for pain.     loratadine 10 MG tablet  Commonly known as:  CLARITIN  Take 10 mg by mouth daily as needed for allergies.     multivitamin with minerals Tabs  Take 1 tablet by mouth daily.     norethindrone-ethinyl estradiol 0.5/0.75/1-35 MG-MCG tablet  Commonly known as:  TRIPHASIL,CYCLAFEM,ALYACEN  Take 1 tablet by mouth daily.         [place], [place] 03/07/13 2330

## 2013-03-07 NOTE — ED Notes (Signed)
Pt was rocking on a metal chair. The chair broke and gashed her leg. Pt has a laceration to right calf area.

## 2013-03-07 NOTE — ED Provider Notes (Signed)
Medical screening examination/treatment/procedure(s) were performed by non-physician practitioner and as supervising physician I was immediately available for consultation/collaboration.  Airika Alkhatib L Lawson [place], MD 03/07/13 2345 

## 2013-06-25 ENCOUNTER — Ambulatory Visit: Payer: Managed Care, Other (non HMO) | Admitting: Family Medicine

## 2013-07-01 ENCOUNTER — Ambulatory Visit (INDEPENDENT_AMBULATORY_CARE_PROVIDER_SITE_OTHER): Payer: Managed Care, Other (non HMO) | Admitting: Family Medicine

## 2013-07-01 ENCOUNTER — Encounter (INDEPENDENT_AMBULATORY_CARE_PROVIDER_SITE_OTHER): Payer: Self-pay

## 2013-07-01 ENCOUNTER — Other Ambulatory Visit: Payer: Self-pay | Admitting: Family Medicine

## 2013-07-01 ENCOUNTER — Encounter: Payer: Self-pay | Admitting: Family Medicine

## 2013-07-01 VITALS — BP 126/72 | HR 100 | Resp 18 | Ht 67.75 in | Wt 261.1 lb

## 2013-07-01 DIAGNOSIS — Z3201 Encounter for pregnancy test, result positive: Secondary | ICD-10-CM

## 2013-07-01 DIAGNOSIS — N926 Irregular menstruation, unspecified: Secondary | ICD-10-CM

## 2013-07-01 DIAGNOSIS — E669 Obesity, unspecified: Secondary | ICD-10-CM

## 2013-07-01 LAB — POCT URINE PREGNANCY: Preg Test, Ur: POSITIVE

## 2013-07-01 LAB — LIPID PANEL
Cholesterol: 139 mg/dL (ref 0–200)
HDL: 43 mg/dL (ref >39)
Total CHOL/HDL Ratio: 3.2 Ratio

## 2013-07-01 NOTE — Progress Notes (Signed)
  Subjective:    Patient ID: Courtney Sims, female    DOB: May 21, 1986, 27 y.o.   MRN: [id number]  HPI Pt in for f/u general health, her mediucal problem is morbid obesity. Unfortunately no improvement, no commitment to regular exercise , eating habits have improved, though portion control clearly remains an issue. C/o increased breast tenderness, menses are just passed due, she has been trying to conceive, discontinued OCP   Review of Systems See HPI /Denies recent fever or chills. Denies sinus pressure, nasal congestion, ear pain or sore throat. Denies chest congestion, productive cough or wheezing. Denies chest pains, palpitations and leg swelling Denies abdominal pain, nausea, vomiting,diarrhea or constipation.   Denies dysuria, frequency, hesitancy or incontinence. Denies joint pain, swelling and limitation in mobility. Denies headaches, seizures, numbness, or tingling. Denies depression, anxiety or insomnia. Denies skin break down or rash.        Objective:   Physical Exam  Patient alert and oriented and in no cardiopulmonary distress.  HEENT: No facial asymmetry, EOMI, no sinus tenderness,  oropharynx pink and moist.  Neck supple no adenopathy.  Chest: Clear to auscultation bilaterally.  CVS: S1, S2 no murmurs, no S3.  ABD: Soft non tender. Bowel sounds normal.  Ext: No edema  CNS: CN 2 to 12 intact, non focal exam       Assessment & Plan:

## 2013-07-01 NOTE — Patient Instructions (Signed)
F/U in  10 month  call when you need me AFTER the event   Commit to regular physical activity, and a lot of vegetable and fruit and enugh rest  Pls let me know obstetrician to refer you to as soon as possible  Additional labs will be added to today's blood draw.  Continue daily prenatals.  CONGRATULATIONS and ALL THE BEST!

## 2013-07-03 ENCOUNTER — Encounter: Payer: Self-pay | Admitting: Family Medicine

## 2013-07-03 DIAGNOSIS — Z3201 Encounter for pregnancy test, result positive: Secondary | ICD-10-CM | POA: Insufficient documentation

## 2013-07-03 NOTE — Assessment & Plan Note (Signed)
Positive urine pregnancy test confirmed with positive quant HCG, pt to establish with OB, will continue prenatal vitamins

## 2013-07-03 NOTE — Assessment & Plan Note (Signed)
Deteriorated. Patient re-educated about  the importance of commitment to a  minimum of 150 minutes of exercise per week. The importance of healthy food choices with portion control discussed. Encouraged to start a food diary, count calories and to consider  joining a support group. Sample diet sheets offered. Goals set by the patient for the next several months.    

## 2013-07-07 ENCOUNTER — Telehealth: Payer: Self-pay | Admitting: Family Medicine

## 2013-07-07 NOTE — Telephone Encounter (Signed)
For the past 2 days she has been having green drainage and started coughing today and some sore throat. She has appt with Obgyn Dec 1st. Please advise what she can use since she is pregnant.

## 2013-07-07 NOTE — Telephone Encounter (Signed)
Left patient a message on her personal voicemail with information

## 2013-07-07 NOTE — Telephone Encounter (Signed)
Salt water gargles, honey and l;ime. Encourage her to see if the OB office is willing to give general advice as far as her symptoms are concerned (since I do not treat pregnant pts)  If not, I can do some research and get back tyo her but not my first option

## 2014-12-30 ENCOUNTER — Telehealth: Payer: Self-pay

## 2014-12-30 DIAGNOSIS — E8881 Metabolic syndrome: Secondary | ICD-10-CM

## 2014-12-30 DIAGNOSIS — E669 Obesity, unspecified: Secondary | ICD-10-CM

## 2014-12-30 DIAGNOSIS — E785 Hyperlipidemia, unspecified: Secondary | ICD-10-CM

## 2014-12-30 NOTE — Telephone Encounter (Signed)
Labs ordered and mailed to patient.  [facility]

## 2014-12-30 NOTE — Addendum Note (Signed)
Addended by: Kandis FantasiaSLADE, Bresha Hosack B on: 12/30/2014 01:13 PM   Modules accepted: Orders

## 2014-12-30 NOTE — Telephone Encounter (Signed)
Fasting CBC, lipid, cmp, HBA1C, TSH and vit D please

## 2015-01-07 LAB — CBC
HEMATOCRIT: 38.4 % (ref 36.0–46.0)
Hemoglobin: 12.9 g/dL (ref 12.0–15.0)
MCH: 28 pg (ref 26.0–34.0)
MCHC: 33.6 g/dL (ref 30.0–36.0)
MCV: 83.5 fL (ref 78.0–100.0)
MPV: 12.1 fL (ref 8.6–12.4)
PLATELETS: 245 10*3/uL (ref 150–400)
RBC: 4.6 MIL/uL (ref 3.87–5.11)
RDW: 14.5 % (ref 11.5–15.5)
WBC: 6.1 10*3/uL (ref 4.0–10.5)

## 2015-01-07 LAB — COMPREHENSIVE METABOLIC PANEL
ALBUMIN: 4.3 g/dL (ref 3.5–5.2)
ALK PHOS: 66 U/L (ref 39–117)
ALT: 20 U/L (ref 0–35)
AST: 14 U/L (ref 0–37)
BUN: 14 mg/dL (ref 6–23)
CHLORIDE: 107 meq/L (ref 96–112)
CO2: 25 meq/L (ref 19–32)
Calcium: 9.2 mg/dL (ref 8.4–10.5)
Creat: 0.64 mg/dL (ref 0.50–1.10)
GLUCOSE: 94 mg/dL (ref 70–99)
Potassium: 4.5 mEq/L (ref 3.5–5.3)
Sodium: 140 mEq/L (ref 135–145)
Total Bilirubin: 0.4 mg/dL (ref 0.2–1.2)
Total Protein: 6.8 g/dL (ref 6.0–8.3)

## 2015-01-07 LAB — LIPID PANEL
CHOLESTEROL: 133 mg/dL (ref 0–200)
HDL: 44 mg/dL — ABNORMAL LOW (ref >=46)
LDL Cholesterol: 80 mg/dL (ref 0–99)
Total CHOL/HDL Ratio: 3 Ratio
Triglycerides: 45 mg/dL (ref <150)
VLDL: 9 mg/dL (ref 0–40)

## 2015-01-07 LAB — TSH: TSH: 0.815 u[IU]/mL (ref 0.350–4.500)

## 2015-01-07 LAB — HEMOGLOBIN A1C
Hgb A1c MFr Bld: 5.8 % — ABNORMAL HIGH (ref <5.7)
Mean Plasma Glucose: 120 mg/dL — ABNORMAL HIGH (ref <117)

## 2015-01-07 LAB — VITAMIN D 25 HYDROXY (VIT D DEFICIENCY, FRACTURES): Vit D, 25-Hydroxy: 24 ng/mL — ABNORMAL LOW (ref 30–100)

## 2015-01-12 ENCOUNTER — Ambulatory Visit (INDEPENDENT_AMBULATORY_CARE_PROVIDER_SITE_OTHER): Payer: BLUE CROSS/BLUE SHIELD | Admitting: Family Medicine

## 2015-01-12 ENCOUNTER — Encounter: Payer: Self-pay | Admitting: Family Medicine

## 2015-01-12 ENCOUNTER — Ambulatory Visit: Payer: Managed Care, Other (non HMO) | Admitting: Family Medicine

## 2015-01-12 DIAGNOSIS — E785 Hyperlipidemia, unspecified: Secondary | ICD-10-CM

## 2015-01-12 DIAGNOSIS — E8881 Metabolic syndrome: Secondary | ICD-10-CM

## 2015-01-12 DIAGNOSIS — J3089 Other allergic rhinitis: Secondary | ICD-10-CM

## 2015-01-12 DIAGNOSIS — E669 Obesity, unspecified: Secondary | ICD-10-CM

## 2015-01-12 DIAGNOSIS — R7309 Other abnormal glucose: Secondary | ICD-10-CM

## 2015-01-12 DIAGNOSIS — R7303 Prediabetes: Secondary | ICD-10-CM

## 2015-01-12 MED ORDER — PHENTERMINE HCL 37.5 MG PO TABS: 37.5000 mg | ORAL_TABLET | Freq: Every day | ORAL | Status: DC

## 2015-01-12 NOTE — Progress Notes (Signed)
Subjective:    Patient ID: Courtney Sims, female    DOB: [birth year], 29 y.o.   MRN: [id number]  HPI   Courtney Sims     MRN: [id number]      DOB: November 16, 1985   HPI Courtney Sims is here for follow up and re-evaluation of chronic medical conditions, medication management and review of any available recent lab and radiology data.  Preventive health is updated, specifically  Cancer screening and Immunization.   Has delivered her first child since last visit, maintians significant weight gain post partum also her main concern. ROS Denies recent fever or chills. Denies sinus pressure, nasal congestion, ear pain or sore throat. Denies chest congestion, productive cough or wheezing. Denies chest pains, palpitations and leg swelling Denies abdominal pain, nausea, vomiting,diarrhea or constipation.   Denies dysuria, frequency, hesitancy or incontinence. Denies joint pain, swelling and limitation in mobility. Denies headaches, seizures, numbness, or tingling. Denies skin break down or rash.   PE  BP 102/64 mmHg  Pulse 86  Resp 18  Ht  (1.702 m)  Wt 280 lb (127.007 kg)  BMI 43.84 kg/m2  SpO2 98%  Patient alert and oriented and in no cardiopulmonary distress.  HEENT: No facial asymmetry, EOMI,   oropharynx pink and moist.  Neck supple no JVD, no mass.  Chest: Clear to auscultation bilaterally.  CVS: S1, S2 no murmurs, no S3.Regular rate.  ABD: Soft non tender.   Ext: No edema  MS: Adequate ROM spine, shoulders, hips and knees.  Skin: Intact, no ulcerations or rash noted.  Psych: Good eye contact, normal affect. Memory intact not anxious or depressed appearing.  CNS: CN 2-12 intact, power,  normal throughout.no focal deficits noted.   Assessment & Plan   Morbid obesity Deteriorated. Patient re-educated about  the importance of commitment to a  minimum of 150 minutes of exercise per week.  The importance of healthy food choices with portion control  discussed. Encouraged to start a food diary, count calories and to consider  joining a support group. Sample diet sheets offered. Goals set by the patient for the next several months.   Weight /BMI 01/12/2015 07/01/2013 03/07/2013  WEIGHT 280 lb 261 lb 1.9 oz 248 lb  HEIGHT  5' 7.75"   BMI 43.84 kg/m2 39.99 kg/m2 38.83 kg/m2    Current exercise per wee 60 minutes. Start phentermine half daily   Dyslipidemia Low HDL , needs to commit to exercise for at least 150 mins per week   Metabolic syndrome X The increased risk of cardiovascular disease associated with this diagnosis, and the need to consistently work on lifestyle to change this is discussed. Following  a  heart healthy diet ,commitment to 30 minutes of exercise at least 5 days per week, as well as control of blood sugar and cholesterol , and achieving a healthy weight are all the areas to be addressed .   Prediabetes Patient educated about the importance of limiting  Carbohydrate intake , the need to commit to daily physical activity for a minimum of 30 minutes , and to commit weight loss. The fact that changes in all these areas will reduce or eliminate all together the development of diabetes is stressed.   Diabetic Labs Latest Ref Rng 01/06/2015 07/01/2013 12/17/2012 12/13/2011 09/09/2011  HbA1c <5.7 % 5.8(H) 5.4 5.5 - 5.5  Chol 0 - 200 mg/dL 469 629 - - 528(U)  HDL >=46 mg/dL 13(K) 43 - - 43  Calc LDL 0 -  99 mg/dL 80 79 - - 829(F133(H)  Triglycerides <150 mg/dL 45 84 - - 621(H198(H)  Creatinine 0.50 - 1.10 mg/dL 0.860.64 - - 5.780.71 4.690.71   BP/Weight 01/12/2015 07/01/2013 03/07/2013 12/18/2012 05/06/2012 03/09/2012 12/13/2011  Systolic BP 102 126 136 120 118 126 112  Diastolic BP 64 72 93 80 80 72 84  Wt. (Lbs) 280 261.12 248 242.12 234.12 239.04 241  BMI 43.84 39.99 38.83 37.34 36.11 36.86 37.17   No flowsheet data found.     Allergic rhinitis No current flares      Review of Systems     Objective:   Physical  Exam        Assessment & Plan:

## 2015-01-12 NOTE — Patient Instructions (Signed)
F/u in 4 months, call if you need me before  Start phentermine half daily, count calories  Aim  For 4 pounds at least per month  It is important that you exercise regularly at least 30 minutes 5 times a week. If you develop chest pain, have severe difficulty breathing, or feel very tired, stop exercising immediately and seek medical attention    Please work on good  health habits so that your health will improve. 1. Commitment to daily physical activity for 30 to 60  minutes, if you are able to do this.  2. Commitment to wise food choices. Aim for half of your  food intake to be vegetable and fruit, one quarter starchy foods, and one quarter protein. Try to eat on a regular schedule  3 meals per day, snacking between meals should be limited to vegetables or fruits or small portions of nuts. 64 ounces of water per day is generally recommended, unless you have specific health conditions, like heart failure or kidney failure where you will need to limit fluid intake.  3. Commitment to sufficient and a  good quality of physical and mental rest daily, generally between 6 to 8 hours per day.  WITH PERSISTANCE AND PERSEVERANCE, THE IMPOSSIBLE , BECOMES THE NORM!   Thanks for choosing [facility], we consider it a privelige to serve you.

## 2015-02-26 DIAGNOSIS — R7303 Prediabetes: Secondary | ICD-10-CM | POA: Insufficient documentation

## 2015-02-26 NOTE — Assessment & Plan Note (Signed)
No current flares

## 2015-02-26 NOTE — Assessment & Plan Note (Signed)
The increased risk of cardiovascular disease associated with this diagnosis, and the need to consistently work on lifestyle to change this is discussed. Following  a  heart healthy diet ,commitment to 30 minutes of exercise at least 5 days per week, as well as control of blood sugar and cholesterol , and achieving a healthy weight are all the areas to be addressed .  

## 2015-02-26 NOTE — Assessment & Plan Note (Signed)
Deteriorated. Patient re-educated about  the importance of commitment to a  minimum of 150 minutes of exercise per week.  The importance of healthy food choices with portion control discussed. Encouraged to start a food diary, count calories and to consider  joining a support group. Sample diet sheets offered. Goals set by the patient for the next several months.   Weight /BMI 01/12/2015 07/01/2013 03/07/2013  WEIGHT 280 lb 261 lb 1.9 oz 248 lb  HEIGHT 5\' 7"  5' 7.75" 5\' 7"   BMI 43.84 kg/m2 39.99 kg/m2 38.83 kg/m2    Current exercise per wee 60 minutes. Start phentermine half daily

## 2015-02-26 NOTE — Assessment & Plan Note (Signed)
Patient educated about the importance of limiting  Carbohydrate intake , the need to commit to daily physical activity for a minimum of 30 minutes , and to commit weight loss. The fact that changes in all these areas will reduce or eliminate all together the development of diabetes is stressed.   Diabetic Labs Latest Ref Rng 01/06/2015 07/01/2013 12/17/2012 12/13/2011 09/09/2011  HbA1c <5.7 % 5.8(H) 5.4 5.5 - 5.5  Chol 0 - 200 mg/dL 161133 096139 - - 045(W216(H)  HDL >=46 mg/dL 09(W44(L) 43 - - 43  Calc LDL 0 - 99 mg/dL 80 79 - - 119(J133(H)  Triglycerides <150 mg/dL 45 84 - - 478(G198(H)  Creatinine 0.50 - 1.10 mg/dL 9.560.64 - - 2.130.71 0.860.71   BP/Weight 01/12/2015 07/01/2013 03/07/2013 12/18/2012 05/06/2012 03/09/2012 12/13/2011  Systolic BP 102 126 136 120 118 126 112  Diastolic BP 64 72 93 80 80 72 84  Wt. (Lbs) 280 261.12 248 242.12 234.12 239.04 241  BMI 43.84 39.99 38.83 37.34 36.11 36.86 37.17   No flowsheet data found.

## 2015-02-26 NOTE — Assessment & Plan Note (Signed)
Low HDL , needs to commit to exercise for at least 150 mins per week

## 2015-05-03 ENCOUNTER — Encounter: Payer: Self-pay | Admitting: Family Medicine

## 2015-05-16 ENCOUNTER — Ambulatory Visit: Payer: BLUE CROSS/BLUE SHIELD | Admitting: Family Medicine

## 2015-07-21 ENCOUNTER — Other Ambulatory Visit (HOSPITAL_COMMUNITY): Payer: Self-pay | Admitting: Specialist

## 2015-07-21 DIAGNOSIS — M2392 Unspecified internal derangement of left knee: Secondary | ICD-10-CM

## 2015-07-31 ENCOUNTER — Ambulatory Visit (HOSPITAL_COMMUNITY)
Admission: RE | Admit: 2015-07-31 | Discharge: 2015-07-31 | Disposition: A | Discharge status: Home / Self Care | Payer: BLUE CROSS/BLUE SHIELD | Source: Ambulatory Visit | Attending: Specialist | Admitting: Specialist

## 2015-07-31 DIAGNOSIS — M2392 Unspecified internal derangement of left knee: Secondary | ICD-10-CM | POA: Insufficient documentation

## 2015-07-31 DIAGNOSIS — R938 Abnormal findings on diagnostic imaging of other specified body structures: Secondary | ICD-10-CM | POA: Insufficient documentation

## 2017-01-04 ENCOUNTER — Encounter: Payer: Self-pay | Admitting: Family Medicine

## 2017-01-09 ENCOUNTER — Ambulatory Visit (INDEPENDENT_AMBULATORY_CARE_PROVIDER_SITE_OTHER): Payer: 59 | Admitting: Family Medicine

## 2017-01-09 ENCOUNTER — Encounter: Payer: Self-pay | Admitting: Family Medicine

## 2017-01-09 VITALS — BP 104/80 | HR 92 | Resp 16 | Ht 67.0 in | Wt 271.0 lb

## 2017-01-09 DIAGNOSIS — J3089 Other allergic rhinitis: Secondary | ICD-10-CM

## 2017-01-09 DIAGNOSIS — Z09 Encounter for follow-up examination after completed treatment for conditions other than malignant neoplasm: Secondary | ICD-10-CM

## 2017-01-09 NOTE — Patient Instructions (Addendum)
F/u in January, call if you need me before,    Keflex will take care of your infection  It is important that you exercise regularly at least 30 minutes 7 times a week. If you develop chest pain, have severe difficulty breathing, or feel very tired, stop exercising immediately and seek medical attention   ENJOY the family!!   Please follow plate method of eating, 50% lunch, and dinner need to be fruit and dinner

## 2017-01-13 NOTE — Assessment & Plan Note (Signed)
Currently asymptomatic 

## 2017-01-13 NOTE — Assessment & Plan Note (Signed)
Improved despite having had a 2nd child! Patient re-educated about  the importance of commitment to a  minimum of 150 minutes of exercise per week.  The importance of healthy food choices with portion control discussed. Encouraged to start a food diary, count calories and to consider  joining a support group. Sample diet sheets offered. Goals set by the patient for the next several months.   Weight /BMI 01/09/2017 01/12/2015 07/01/2013  WEIGHT 271 lb 280 lb 261 lb 1.9 oz  HEIGHT 5\' 7"  5\' 7"  5' 7.75"  BMI 42.44 kg/m2 43.84 kg/m2 39.99 kg/m2

## 2017-01-13 NOTE — Assessment & Plan Note (Signed)
Reports being afebrile within 24 hours of ED visit, has tolerated antibiotic with no adverse s/e . Denies any significant urinary symptoms, states feels more likely had "early mastitis" however, denies any pain or redness of either breast and never stopped breastfeeding her 813 week old infant

## 2017-01-13 NOTE — Progress Notes (Signed)
   Courtney Sims     MRN: [id number]      DOB: 4/23/Judie Petit1987   HPI Courtney Sims is here for follow up of recent ED visit when she presented febrile and breast feeding. Diagnosis at the visit was a UTI, cultures show growth of strep , less than 100,000, which will be sensitive to the keflex which she was treated with. Denies fever for more than 24 hrs. States she never had urinary symptoms and feels she was febrile from "early  Mastitis" Never stopped breast feeding and denies redness or pain in the breast Has a 723 week old infant , and  Is home on maternity leave with her husband and making the best of motherhood and thoroughly enjoying it ROS  Denies sinus pressure, nasal congestion, ear pain or sore throat. Denies chest congestion, productive cough or wheezing. Denies chest pains, palpitations and leg swelling Denies abdominal pain, nausea, vomiting,diarrhea or constipation.   Denies dysuria, frequency, hesitancy or incontinence. Denies joint pain, swelling and limitation in mobility. Denies headaches, seizures, numbness, or tingling.   PE  BP 104/80   Pulse 92   Resp 16   Ht 5\' 7"  (1.702 m)   Wt 271 lb (122.9 kg)   SpO2 99%   BMI 42.44 kg/m   Patient alert and oriented and in no cardiopulmonary distress.  HEENT: No facial asymmetry, EOMI,   oropharynx pink and moist.  Neck supple no JVD, no mass.  Chest: Clear to auscultation bilaterally.  CVS: S1, S2 no murmurs, no S3.Regular rate.  ABD: Soft non tender.   Ext: No edema  MS: Adequate ROM spine, shoulders, hips and knees.  Skin: Intact, no ulcerations or rash noted.  Psych: Good eye contact, normal affect. Memory intact not anxious or depressed appearing.  CNS: CN 2-12 intact, power,  normal throughout.no focal deficits noted.   Assessment & Plan  Encounter for examination following treatment at hospital Reports being afebrile within 24 hours of ED visit, has tolerated antibiotic with no adverse s/e . Denies any  significant urinary symptoms, states feels more likely had "early mastitis" however, denies any pain or redness of either breast and never stopped breastfeeding her 193 week old infant  Morbid obesity Improved despite having had a 2nd child! Patient re-educated about  the importance of commitment to a  minimum of 150 minutes of exercise per week.  The importance of healthy food choices with portion control discussed. Encouraged to start a food diary, count calories and to consider  joining a support group. Sample diet sheets offered. Goals set by the patient for the next several months.   Weight /BMI 01/09/2017 01/12/2015 07/01/2013  WEIGHT 271 lb 280 lb 261 lb 1.9 oz  HEIGHT 5\' 7"  5\' 7"  5' 7.75"  BMI 42.44 kg/m2 43.84 kg/m2 39.99 kg/m2      Allergic rhinitis Currently asymptomatic
